# Patient Record
Sex: Female | Born: 1970 | Race: White | Hispanic: No | Marital: Married | Smoking: Never smoker
Health system: Southern US, Community
[De-identification: ages and names within clinical notes are randomized; demographics above are authoritative.]

## PROBLEM LIST (undated history)

## (undated) DIAGNOSIS — D219 Benign neoplasm of connective and other soft tissue, unspecified: Secondary | ICD-10-CM

## (undated) DIAGNOSIS — E7800 Pure hypercholesterolemia, unspecified: Secondary | ICD-10-CM

## (undated) HISTORY — PX: TUBAL LIGATION: SHX77

## (undated) HISTORY — PX: OTHER SURGICAL HISTORY: SHX169

## (undated) HISTORY — DX: Benign neoplasm of connective and other soft tissue, unspecified: D21.9

## (undated) HISTORY — PX: BREAST CYST ASPIRATION: SHX578

---

## 1998-06-20 ENCOUNTER — Other Ambulatory Visit: Admission: RE | Admit: 1998-06-20 | Discharge: 1998-06-20 | Payer: Self-pay | Admitting: Obstetrics and Gynecology

## 1999-01-30 ENCOUNTER — Other Ambulatory Visit: Admission: RE | Admit: 1999-01-30 | Discharge: 1999-01-30 | Payer: Self-pay | Admitting: Obstetrics and Gynecology

## 1999-08-02 ENCOUNTER — Encounter: Admission: RE | Admit: 1999-08-02 | Discharge: 1999-10-31 | Payer: Self-pay | Admitting: Obstetrics and Gynecology

## 1999-08-19 ENCOUNTER — Inpatient Hospital Stay (HOSPITAL_COMMUNITY): Admission: AD | Admit: 1999-08-19 | Discharge: 1999-08-21 | Payer: Self-pay | Admitting: Obstetrics and Gynecology

## 1999-10-01 ENCOUNTER — Other Ambulatory Visit: Admission: RE | Admit: 1999-10-01 | Discharge: 1999-10-01 | Payer: Self-pay | Admitting: Obstetrics and Gynecology

## 2000-11-30 ENCOUNTER — Other Ambulatory Visit: Admission: RE | Admit: 2000-11-30 | Discharge: 2000-11-30 | Payer: Self-pay | Admitting: Obstetrics and Gynecology

## 2001-12-27 ENCOUNTER — Other Ambulatory Visit: Admission: RE | Admit: 2001-12-27 | Discharge: 2001-12-27 | Payer: Self-pay | Admitting: Obstetrics and Gynecology

## 2002-12-30 ENCOUNTER — Other Ambulatory Visit: Admission: RE | Admit: 2002-12-30 | Discharge: 2002-12-30 | Payer: Self-pay | Admitting: Obstetrics and Gynecology

## 2004-01-30 ENCOUNTER — Other Ambulatory Visit: Admission: RE | Admit: 2004-01-30 | Discharge: 2004-01-30 | Payer: Self-pay | Admitting: Obstetrics and Gynecology

## 2005-02-07 ENCOUNTER — Other Ambulatory Visit: Admission: RE | Admit: 2005-02-07 | Discharge: 2005-02-07 | Payer: Self-pay | Admitting: Obstetrics and Gynecology

## 2005-05-28 ENCOUNTER — Ambulatory Visit: Payer: Self-pay | Admitting: Cardiology

## 2006-04-03 ENCOUNTER — Encounter: Admission: RE | Admit: 2006-04-03 | Discharge: 2006-04-03 | Payer: Self-pay | Admitting: Obstetrics and Gynecology

## 2006-04-06 ENCOUNTER — Encounter: Admission: RE | Admit: 2006-04-06 | Discharge: 2006-04-06 | Payer: Self-pay | Admitting: Obstetrics and Gynecology

## 2007-04-08 ENCOUNTER — Encounter: Admission: RE | Admit: 2007-04-08 | Discharge: 2007-04-08 | Payer: Self-pay | Admitting: Obstetrics and Gynecology

## 2008-05-09 ENCOUNTER — Encounter: Admission: RE | Admit: 2008-05-09 | Discharge: 2008-05-09 | Payer: Self-pay | Admitting: Obstetrics and Gynecology

## 2010-12-23 ENCOUNTER — Other Ambulatory Visit: Payer: Self-pay | Admitting: Obstetrics and Gynecology

## 2010-12-23 DIAGNOSIS — N644 Mastodynia: Secondary | ICD-10-CM

## 2010-12-26 ENCOUNTER — Ambulatory Visit
Admission: RE | Admit: 2010-12-26 | Discharge: 2010-12-26 | Disposition: A | Discharge status: Home / Self Care | Payer: BC Managed Care – PPO | Source: Ambulatory Visit | Attending: Obstetrics and Gynecology | Admitting: Obstetrics and Gynecology

## 2010-12-26 DIAGNOSIS — N644 Mastodynia: Secondary | ICD-10-CM

## 2011-02-14 NOTE — Discharge Summary (Signed)
[facility]  Patient:    Molly Roth                   MRN: [id number] Adm. Date:  [id number] Disc. Date: 08/21/99 Attending:  Madelyn Flavors Dictator:   Danie Chandler, R.N.                           Discharge Summary  ADMITTING DIAGNOSES:          1. Intrauterine pregnancy at term, in labor.                               2. Breech presentation.  DISCHARGE DIAGNOSES:          1. Intrauterine pregnancy at term, in labor.                               2. Breech presentation.                               3. Anemia.  PROCEDURE:                    On August 19, 1999. primary low transverse cesarean section.  REASON FOR ADMISSION:         The patient is a 40 year old gravida 2, para 1 married white female with an estimated gestational age of 39-1/2 weeks.  She presented in labor.  Her prenatal course has been uncomplicated.  The baby is in breech presentation.  This was confirmed by bedside ultrasound.  HOSPITAL COURSE:              The patient was taken to the operating room and undergoes the above named procedure without complications.  This is productive f a viable female infant with Apgars of 9 at one minute and 9 at five minutes and an arterial cord pH of 7.31.  Postoperatively the patient does well.   On postoperative day #1 the patients hemoglobin is 8.4, hematocrit 25.1 and white blood cell count 6.8.  On postoperative day #2 the patient requests discharge. She does have good return of bowel function, is tolerating a regular diet.  She also has good pain control and is ambulating well without difficulty.  Her hemoglobin is stable at 8.1.  CONDITION ON DISCHARGE:       Good.  DIET:                         Regular as tolerated.  ACTIVITY:                     No heavy lifting, no driving, no vaginal entry.  FOLLOWUP:                     In the office in two weeks for incision check and  call for temperature greater  than 100 degrees, persistent nausea or vomiting, heavy vaginal bleeding, and/or redness or drainage from the incision site.  DISCHARGE MEDICATIONS:        Prenatal vitamin one p.o. q.d.  Nu-Iron 150 mg one p.o. q.d.  Tylox prescription as prescribed by M.D.DD:  08/21/99 TD:  08/22/99 Job: [id number]

## 2012-01-13 ENCOUNTER — Other Ambulatory Visit: Payer: Self-pay | Admitting: Obstetrics and Gynecology

## 2012-01-13 DIAGNOSIS — Z1231 Encounter for screening mammogram for malignant neoplasm of breast: Secondary | ICD-10-CM

## 2012-01-26 ENCOUNTER — Ambulatory Visit
Admission: RE | Admit: 2012-01-26 | Discharge: 2012-01-26 | Disposition: A | Discharge status: Home / Self Care | Payer: BC Managed Care – PPO | Source: Ambulatory Visit | Attending: Obstetrics and Gynecology | Admitting: Obstetrics and Gynecology

## 2012-01-26 DIAGNOSIS — Z1231 Encounter for screening mammogram for malignant neoplasm of breast: Secondary | ICD-10-CM

## 2012-01-28 ENCOUNTER — Other Ambulatory Visit: Payer: Self-pay | Admitting: Obstetrics and Gynecology

## 2012-01-28 DIAGNOSIS — R928 Other abnormal and inconclusive findings on diagnostic imaging of breast: Secondary | ICD-10-CM

## 2012-01-30 ENCOUNTER — Ambulatory Visit
Admission: RE | Admit: 2012-01-30 | Discharge: 2012-01-30 | Disposition: A | Discharge status: Home / Self Care | Payer: BC Managed Care – PPO | Source: Ambulatory Visit | Attending: Obstetrics and Gynecology | Admitting: Obstetrics and Gynecology

## 2012-01-30 DIAGNOSIS — R928 Other abnormal and inconclusive findings on diagnostic imaging of breast: Secondary | ICD-10-CM

## 2013-03-14 ENCOUNTER — Other Ambulatory Visit: Payer: Self-pay

## 2013-03-14 DIAGNOSIS — Z1231 Encounter for screening mammogram for malignant neoplasm of breast: Secondary | ICD-10-CM

## 2013-03-31 ENCOUNTER — Ambulatory Visit
Admission: RE | Admit: 2013-03-31 | Discharge: 2013-03-31 | Disposition: A | Discharge status: Home / Self Care | Payer: BC Managed Care – PPO | Source: Ambulatory Visit

## 2013-03-31 ENCOUNTER — Inpatient Hospital Stay: Admission: RE | Admit: 2013-03-31 | Payer: BC Managed Care – PPO | Source: Ambulatory Visit

## 2013-03-31 DIAGNOSIS — Z1231 Encounter for screening mammogram for malignant neoplasm of breast: Secondary | ICD-10-CM

## 2013-04-05 ENCOUNTER — Other Ambulatory Visit: Payer: Self-pay | Admitting: Obstetrics and Gynecology

## 2013-04-05 DIAGNOSIS — N6320 Unspecified lump in the left breast, unspecified quadrant: Secondary | ICD-10-CM

## 2013-04-13 ENCOUNTER — Ambulatory Visit
Admission: RE | Admit: 2013-04-13 | Discharge: 2013-04-13 | Disposition: A | Discharge status: Home / Self Care | Payer: BC Managed Care – PPO | Source: Ambulatory Visit | Attending: Obstetrics and Gynecology | Admitting: Obstetrics and Gynecology

## 2013-04-13 ENCOUNTER — Other Ambulatory Visit: Payer: Self-pay | Admitting: Obstetrics and Gynecology

## 2013-04-13 DIAGNOSIS — N6320 Unspecified lump in the left breast, unspecified quadrant: Secondary | ICD-10-CM

## 2014-03-21 ENCOUNTER — Other Ambulatory Visit: Payer: Self-pay

## 2014-03-21 ENCOUNTER — Other Ambulatory Visit: Payer: Self-pay | Admitting: Obstetrics and Gynecology

## 2014-03-21 DIAGNOSIS — Z1231 Encounter for screening mammogram for malignant neoplasm of breast: Secondary | ICD-10-CM

## 2014-04-24 ENCOUNTER — Ambulatory Visit
Admission: RE | Admit: 2014-04-24 | Discharge: 2014-04-24 | Disposition: A | Discharge status: Home / Self Care | Payer: BC Managed Care – PPO | Source: Ambulatory Visit

## 2014-04-24 ENCOUNTER — Encounter (INDEPENDENT_AMBULATORY_CARE_PROVIDER_SITE_OTHER): Payer: Self-pay

## 2014-04-24 DIAGNOSIS — Z1231 Encounter for screening mammogram for malignant neoplasm of breast: Secondary | ICD-10-CM

## 2015-04-04 ENCOUNTER — Other Ambulatory Visit: Payer: Self-pay

## 2015-04-04 DIAGNOSIS — Z1231 Encounter for screening mammogram for malignant neoplasm of breast: Secondary | ICD-10-CM

## 2015-04-30 ENCOUNTER — Ambulatory Visit
Admission: RE | Admit: 2015-04-30 | Discharge: 2015-04-30 | Disposition: A | Discharge status: Home / Self Care | Payer: BLUE CROSS/BLUE SHIELD | Source: Ambulatory Visit

## 2015-04-30 DIAGNOSIS — Z1231 Encounter for screening mammogram for malignant neoplasm of breast: Secondary | ICD-10-CM

## 2016-03-31 ENCOUNTER — Ambulatory Visit (INDEPENDENT_AMBULATORY_CARE_PROVIDER_SITE_OTHER): Payer: BLUE CROSS/BLUE SHIELD | Admitting: Family

## 2016-03-31 ENCOUNTER — Encounter: Payer: Self-pay | Admitting: Family

## 2016-03-31 VITALS — BP 114/75 | HR 95 | Temp 99.2°F | Ht 64.0 in | Wt 154.8 lb

## 2016-03-31 DIAGNOSIS — B029 Zoster without complications: Secondary | ICD-10-CM

## 2016-03-31 MED ORDER — VALACYCLOVIR HCL 1 G PO TABS: 1000.0000 mg | ORAL_TABLET | Freq: Three times a day (TID) | ORAL | Status: DC

## 2016-03-31 NOTE — Progress Notes (Signed)
   Subjective:    Patient ID: Molly Roth, female    DOB: October 08, 1970, 45 y.o.   MRN: [id number]  Rash This is a new problem. The current episode started yesterday. The problem is unchanged. The affected locations include the abdomen (Right back extending to right abdomen). The rash is characterized by blistering, redness and pain. She was exposed to nothing. Pertinent negatives include no congestion, diarrhea, fatigue, joint pain, shortness of breath or sore throat. Past treatments include nothing. The treatment provided no relief.      Review of Systems  Constitutional: Negative.  Negative for fatigue.  HENT: Negative.  Negative for congestion and sore throat.   Eyes: Negative.   Respiratory: Negative.  Negative for shortness of breath.   Cardiovascular: Negative.  Negative for palpitations.  Gastrointestinal: Negative.  Negative for diarrhea.  Endocrine: Negative.   Genitourinary: Negative.   Musculoskeletal: Negative.  Negative for joint pain.  Skin: Positive for rash.  Neurological: Negative.  Negative for headaches.  Hematological: Negative.   Psychiatric/Behavioral: Negative.   All other systems reviewed and are negative.      Objective:   Physical Exam  Constitutional: She is oriented to person, place, and time. She appears well-developed and well-nourished. No distress.  Neck: Normal range of motion. Neck supple. No thyromegaly present.  Cardiovascular: Normal rate, regular rhythm, normal heart sounds and intact distal pulses.   No murmur heard. Pulmonary/Chest: Effort normal and breath sounds normal. No respiratory distress. She has no wheezes.  Abdominal: Soft. Bowel sounds are normal. She exhibits no distension. There is no tenderness.  Musculoskeletal: Normal range of motion. She exhibits no edema or tenderness.  Neurological: She is alert and oriented to person, place, and time. She has normal reflexes. No cranial nerve deficit.  Skin: Skin is warm and dry.  Rash noted.  Linear blister rash that stretches from right middle back to right abdomen  Psychiatric: She has a normal mood and affect. Her behavior is normal. Judgment and thought content normal.  Vitals reviewed.    BP 114/75 mmHg  Pulse 95  Temp(Src) 99.2 F (37.3 C) (Oral)  Ht 5\' 4"  (1.626 m)  Wt 154 lb 12.8 oz (70.217 kg)  BMI 26.56 kg/m2  LMP 03/17/2016 (Approximate)      Assessment & Plan:  1. Shingles -Do not scratch -Avoid immune compromised -Rest -Cool compresses as needed -RTO prn - valACYclovir (VALTREX) 1000 MG tablet; Take 1 tablet (1,000 mg total) by mouth 3 (three) times daily.  Dispense: 21 tablet; Refill: 0  Evelina Dun, FNP

## 2016-03-31 NOTE — Patient Instructions (Signed)

## 2016-04-23 ENCOUNTER — Other Ambulatory Visit: Payer: Self-pay | Admitting: Obstetrics and Gynecology

## 2016-04-23 DIAGNOSIS — Z1231 Encounter for screening mammogram for malignant neoplasm of breast: Secondary | ICD-10-CM

## 2016-05-01 ENCOUNTER — Ambulatory Visit
Admission: RE | Admit: 2016-05-01 | Discharge: 2016-05-01 | Disposition: A | Discharge status: Home / Self Care | Payer: BLUE CROSS/BLUE SHIELD | Source: Ambulatory Visit | Attending: Obstetrics and Gynecology | Admitting: Obstetrics and Gynecology

## 2016-05-01 DIAGNOSIS — Z1231 Encounter for screening mammogram for malignant neoplasm of breast: Secondary | ICD-10-CM

## 2016-05-05 ENCOUNTER — Other Ambulatory Visit: Payer: Self-pay | Admitting: Obstetrics and Gynecology

## 2016-05-05 DIAGNOSIS — R928 Other abnormal and inconclusive findings on diagnostic imaging of breast: Secondary | ICD-10-CM

## 2016-05-09 ENCOUNTER — Ambulatory Visit
Admission: RE | Admit: 2016-05-09 | Discharge: 2016-05-09 | Disposition: A | Discharge status: Home / Self Care | Payer: BLUE CROSS/BLUE SHIELD | Source: Ambulatory Visit | Attending: Obstetrics and Gynecology | Admitting: Obstetrics and Gynecology

## 2016-05-09 DIAGNOSIS — R928 Other abnormal and inconclusive findings on diagnostic imaging of breast: Secondary | ICD-10-CM

## 2017-04-09 ENCOUNTER — Other Ambulatory Visit: Payer: Self-pay | Admitting: Obstetrics and Gynecology

## 2017-04-09 DIAGNOSIS — Z1231 Encounter for screening mammogram for malignant neoplasm of breast: Secondary | ICD-10-CM

## 2017-05-11 ENCOUNTER — Other Ambulatory Visit: Payer: Self-pay | Admitting: Obstetrics and Gynecology

## 2017-05-11 ENCOUNTER — Ambulatory Visit
Admission: RE | Admit: 2017-05-11 | Discharge: 2017-05-11 | Disposition: A | Discharge status: Home / Self Care | Payer: BLUE CROSS/BLUE SHIELD | Source: Ambulatory Visit | Attending: Obstetrics and Gynecology | Admitting: Obstetrics and Gynecology

## 2017-05-11 DIAGNOSIS — Z1231 Encounter for screening mammogram for malignant neoplasm of breast: Secondary | ICD-10-CM

## 2018-05-04 ENCOUNTER — Other Ambulatory Visit: Payer: Self-pay | Admitting: Obstetrics and Gynecology

## 2018-05-04 DIAGNOSIS — Z1231 Encounter for screening mammogram for malignant neoplasm of breast: Secondary | ICD-10-CM

## 2018-05-28 ENCOUNTER — Ambulatory Visit
Admission: RE | Admit: 2018-05-28 | Discharge: 2018-05-28 | Disposition: A | Discharge status: Home / Self Care | Payer: BLUE CROSS/BLUE SHIELD | Source: Ambulatory Visit | Attending: Obstetrics and Gynecology | Admitting: Obstetrics and Gynecology

## 2018-05-28 DIAGNOSIS — Z1231 Encounter for screening mammogram for malignant neoplasm of breast: Secondary | ICD-10-CM

## 2019-06-08 ENCOUNTER — Other Ambulatory Visit: Payer: Self-pay | Admitting: Obstetrics and Gynecology

## 2019-06-08 DIAGNOSIS — Z1231 Encounter for screening mammogram for malignant neoplasm of breast: Secondary | ICD-10-CM

## 2019-07-22 ENCOUNTER — Ambulatory Visit
Admission: RE | Admit: 2019-07-22 | Discharge: 2019-07-22 | Disposition: A | Discharge status: Home / Self Care | Payer: 59 | Source: Ambulatory Visit | Attending: Obstetrics and Gynecology | Admitting: Obstetrics and Gynecology

## 2019-07-22 ENCOUNTER — Other Ambulatory Visit: Payer: Self-pay

## 2019-07-22 DIAGNOSIS — Z1231 Encounter for screening mammogram for malignant neoplasm of breast: Secondary | ICD-10-CM

## 2020-02-16 ENCOUNTER — Other Ambulatory Visit: Payer: Self-pay | Admitting: Obstetrics and Gynecology

## 2020-02-16 DIAGNOSIS — N6320 Unspecified lump in the left breast, unspecified quadrant: Secondary | ICD-10-CM

## 2020-02-24 ENCOUNTER — Other Ambulatory Visit: Payer: Self-pay

## 2020-02-24 ENCOUNTER — Ambulatory Visit
Admission: RE | Admit: 2020-02-24 | Discharge: 2020-02-24 | Disposition: A | Discharge status: Home / Self Care | Payer: 59 | Source: Ambulatory Visit | Attending: Obstetrics and Gynecology | Admitting: Obstetrics and Gynecology

## 2020-02-24 ENCOUNTER — Other Ambulatory Visit: Payer: Self-pay | Admitting: Obstetrics and Gynecology

## 2020-02-24 DIAGNOSIS — N6320 Unspecified lump in the left breast, unspecified quadrant: Secondary | ICD-10-CM

## 2020-03-02 ENCOUNTER — Ambulatory Visit
Admission: RE | Admit: 2020-03-02 | Discharge: 2020-03-02 | Disposition: A | Discharge status: Home / Self Care | Payer: 59 | Source: Ambulatory Visit | Attending: Obstetrics and Gynecology | Admitting: Obstetrics and Gynecology

## 2020-03-02 ENCOUNTER — Other Ambulatory Visit: Payer: Self-pay

## 2020-03-02 DIAGNOSIS — N6320 Unspecified lump in the left breast, unspecified quadrant: Secondary | ICD-10-CM

## 2020-03-23 ENCOUNTER — Other Ambulatory Visit: Payer: Self-pay | Admitting: Surgery

## 2020-03-23 ENCOUNTER — Ambulatory Visit: Payer: Self-pay | Admitting: Surgery

## 2020-03-23 DIAGNOSIS — N6452 Nipple discharge: Secondary | ICD-10-CM

## 2020-06-18 ENCOUNTER — Other Ambulatory Visit: Payer: 59

## 2020-06-18 ENCOUNTER — Other Ambulatory Visit: Payer: Self-pay

## 2020-06-18 DIAGNOSIS — Z20822 Contact with and (suspected) exposure to covid-19: Secondary | ICD-10-CM

## 2020-06-20 LAB — NOVEL CORONAVIRUS, NAA: SARS-CoV-2, NAA: NOT DETECTED

## 2020-06-20 LAB — SARS-COV-2, NAA 2 DAY TAT

## 2020-06-20 LAB — SPECIMEN STATUS REPORT

## 2020-07-04 ENCOUNTER — Other Ambulatory Visit: Payer: 59

## 2020-07-04 ENCOUNTER — Other Ambulatory Visit: Payer: Self-pay

## 2020-07-04 DIAGNOSIS — Z20822 Contact with and (suspected) exposure to covid-19: Secondary | ICD-10-CM

## 2020-07-05 LAB — SPECIMEN STATUS REPORT

## 2020-07-05 LAB — NOVEL CORONAVIRUS, NAA: SARS-CoV-2, NAA: NOT DETECTED

## 2020-07-05 LAB — SARS-COV-2, NAA 2 DAY TAT

## 2020-07-23 ENCOUNTER — Ambulatory Visit
Admission: RE | Admit: 2020-07-23 | Discharge: 2020-07-23 | Disposition: A | Discharge status: Home / Self Care | Payer: No Typology Code available for payment source | Source: Ambulatory Visit | Attending: Surgery | Admitting: Surgery

## 2020-07-23 DIAGNOSIS — N6452 Nipple discharge: Secondary | ICD-10-CM

## 2020-07-23 MED ORDER — GADOBUTROL 1 MMOL/ML IV SOLN
7.0000 mL | Freq: Once | INTRAVENOUS | Status: AC | PRN
  Administered 2020-07-23: 7 mL via INTRAVENOUS

## 2020-07-24 ENCOUNTER — Other Ambulatory Visit: Payer: Self-pay | Admitting: Surgery

## 2020-07-24 DIAGNOSIS — N6310 Unspecified lump in the right breast, unspecified quadrant: Secondary | ICD-10-CM

## 2020-07-26 ENCOUNTER — Other Ambulatory Visit: Payer: Self-pay | Admitting: Surgery

## 2020-07-26 DIAGNOSIS — N6310 Unspecified lump in the right breast, unspecified quadrant: Secondary | ICD-10-CM

## 2020-08-02 ENCOUNTER — Other Ambulatory Visit: Payer: Self-pay

## 2020-08-02 ENCOUNTER — Ambulatory Visit
Admission: RE | Admit: 2020-08-02 | Discharge: 2020-08-02 | Disposition: A | Discharge status: Home / Self Care | Payer: No Typology Code available for payment source | Source: Ambulatory Visit | Attending: Surgery | Admitting: Surgery

## 2020-08-02 ENCOUNTER — Ambulatory Visit
Admission: RE | Admit: 2020-08-02 | Discharge: 2020-08-02 | Disposition: A | Discharge status: Home / Self Care | Payer: 59 | Source: Ambulatory Visit | Attending: Surgery | Admitting: Surgery

## 2020-08-02 DIAGNOSIS — N6310 Unspecified lump in the right breast, unspecified quadrant: Secondary | ICD-10-CM

## 2020-08-02 MED ORDER — GADOBUTROL 1 MMOL/ML IV SOLN
8.0000 mL | Freq: Once | INTRAVENOUS | Status: AC | PRN
  Administered 2020-08-02: 8 mL via INTRAVENOUS

## 2020-08-08 ENCOUNTER — Other Ambulatory Visit: Payer: Self-pay | Admitting: Surgery

## 2020-08-08 DIAGNOSIS — N611 Abscess of the breast and nipple: Secondary | ICD-10-CM

## 2020-08-08 DIAGNOSIS — R9389 Abnormal findings on diagnostic imaging of other specified body structures: Secondary | ICD-10-CM

## 2020-09-03 ENCOUNTER — Ambulatory Visit
Admission: RE | Admit: 2020-09-03 | Discharge: 2020-09-03 | Disposition: A | Discharge status: Home / Self Care | Payer: No Typology Code available for payment source | Source: Ambulatory Visit | Attending: Surgery | Admitting: Surgery

## 2020-09-03 ENCOUNTER — Ambulatory Visit
Admission: RE | Admit: 2020-09-03 | Discharge: 2020-09-03 | Disposition: A | Discharge status: Home / Self Care | Payer: Self-pay | Source: Ambulatory Visit | Attending: Surgery | Admitting: Surgery

## 2020-09-03 ENCOUNTER — Other Ambulatory Visit: Payer: Self-pay

## 2020-09-03 ENCOUNTER — Other Ambulatory Visit (HOSPITAL_COMMUNITY): Payer: Self-pay | Admitting: Diagnostic Radiology

## 2020-09-03 DIAGNOSIS — N611 Abscess of the breast and nipple: Secondary | ICD-10-CM

## 2020-09-03 DIAGNOSIS — R9389 Abnormal findings on diagnostic imaging of other specified body structures: Secondary | ICD-10-CM

## 2020-09-03 MED ORDER — GADOBUTROL 1 MMOL/ML IV SOLN: 7.0000 mL | Freq: Once | INTRAVENOUS | Status: DC | PRN

## 2020-09-10 ENCOUNTER — Ambulatory Visit: Payer: Self-pay | Admitting: Surgery

## 2020-09-10 DIAGNOSIS — D242 Benign neoplasm of left breast: Secondary | ICD-10-CM

## 2020-09-10 NOTE — H&P (Signed)
History of Present Illness Molly Roth. Audrianna Driskill MD; 09/10/2020 6:20 PM) The patient is a 49 year old female who presents with a breast mass. PCP - [place]   This is a healthy 49 year old female who recently presented with a small palpable retroareolar mass as well as some clear nipple discharge. She underwent mammogram and ultrasound. She has multiple cysts that likely represented the palpable mass. However in the left retroareolar breast at 5:00 there is a 1.4 cm intraductal mass. Ultrasound-guided biopsy was performed. However pathology revealed only normal mammary tissue. She presents now to discuss further workup and treatment. She states that she continues to see occasional discharge. She has no pain in this breast. No previous breast surgery. She does have a long history of breast cysts but fluctuate in size.  Family history of breast cancer in 2 different maternal aunts.  MRI on 07/23/20 showed a indeterminate rim enhancing cyst in the left breast, which was later biopsied under MR guidance. This revealed intraductal papilloma. The MRI also a 9 mm mass in the right lower outer quadrant at anterior depth, which was biopsied under MRI guidance. This revealed ductal papilloma, fibroadenoma, usual ductal hyperplasia. Biopsy clips were placed at all three sites.   Problem List/Past Medical Rodman Key K. 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 Josedaniel Haye, MD; 09/10/2020 6:21 PM) Breast Biopsy Left. Cesarean Section - 1  Diagnostic Studies History Molly Roth. Mirza Fessel, MD; 09/10/2020 6:21 PM) Colonoscopy never Mammogram within last year Pap Smear 1-5 years ago  Allergies Mallie Snooks, CMA; 09/10/2020 9:15 AM) No Known Drug Allergies [03/23/2020]: Allergies Reconciled  Medication History Mallie Snooks, CMA; 09/10/2020 9:15 AM) Multiple Vitamin (1 (one) Oral) Active. No Current Medications (Taken starting 09/10/2020) Zinc (Oral) Specific strength unknown - Active. Medications Reconciled  Social History Molly Roth. Costella Schwarz, MD; 09/10/2020 6:21 PM) Caffeine use Carbonated beverages. No alcohol use No drug use Tobacco use Never smoker.  Family History Molly Roth. Georgette Dover, MD; 09/10/2020 6:21 PM) First Degree Relatives No pertinent family history  Pregnancy / Birth History Molly Roth. Bryn Saline, MD; 09/10/2020 6:21 PM) Age at menarche 47 years. Gravida 2 Length (months) of breastfeeding 3-6 Maternal age 54-25 Para 2 Regular periods  Other Problems Molly Roth. Josua Ferrebee, MD; 09/10/2020 6:21 PM) Hypercholesterolemia    Vitals Lonia Mad Thorpe CMA; 09/10/2020 9:15 AM) 09/10/2020 9:15 AM Weight: 171.13 lb Height: 63in Body Surface Area: 1.81 m Body Mass Index: 30.31 kg/m  Temp.: 96.18F  Pulse: 115 (Regular)  P.OX: 99% (Room air) BP: 150/90(Sitting, Left Arm, Standard)        Physical Exam Rodman Key K. Smriti Barkow MD; 09/10/2020 6:21 PM)  The physical exam findings are as follows: Note:Constitutional: WDWN in NAD, conversant, no obvious deformities; resting comfortably Eyes: Pupils equal, round; sclera anicteric; moist conjunctiva; no lid lag HENT: Oral mucosa moist; good dentition Neck: No masses palpated, trachea midline; no thyromegaly Lungs: CTA bilaterally; normal respiratory effort Breasts: symmetric; no nipple changes; no nipple discharge; palpable cystic areas in both lateral breasts; no dominant firm masses; no axillary or supraclavicular lymphadenopathy; bruising in lateral left breast CV: Regular rate and rhythm; no murmurs; extremities well-perfused with no edema Abd: +bowel sounds, soft, non-tender, no palpable organomegaly; no palpable hernias Musc: Normal gait; no apparent clubbing or cyanosis in extremities Lymphatic: No palpable  cervical or axillary lymphadenopathy Skin: Warm, dry; no sign of jaundice  Psychiatric - alert and oriented x 4; calm mood and affect    Assessment & Plan Rodman Key K. 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 Potential risks, benefits, alternative treatments, and expected outcomes have been explained. All of the patient's questions at this time have been answered. The likelihood of reaching the patient's treatment goal is good. The patient understand the proposed surgical procedure and wishes to proceed.  Molly Roth. Georgette Dover, MD, [facility]  General/ Trauma Surgery   09/10/2020 6:22 PM

## 2020-10-08 ENCOUNTER — Other Ambulatory Visit: Payer: Self-pay | Admitting: Surgery

## 2020-10-08 DIAGNOSIS — D242 Benign neoplasm of left breast: Secondary | ICD-10-CM

## 2020-10-08 DIAGNOSIS — D241 Benign neoplasm of right breast: Secondary | ICD-10-CM

## 2020-10-29 ENCOUNTER — Other Ambulatory Visit: Payer: Self-pay

## 2020-10-29 ENCOUNTER — Encounter (HOSPITAL_BASED_OUTPATIENT_CLINIC_OR_DEPARTMENT_OTHER): Payer: Self-pay | Admitting: Surgery

## 2020-11-02 ENCOUNTER — Other Ambulatory Visit (HOSPITAL_COMMUNITY)
Admission: RE | Admit: 2020-11-02 | Discharge: 2020-11-02 | Disposition: A | Discharge status: Home / Self Care | Payer: 59 | Source: Ambulatory Visit | Attending: Surgery | Admitting: Surgery

## 2020-11-02 DIAGNOSIS — Z01812 Encounter for preprocedural laboratory examination: Secondary | ICD-10-CM | POA: Diagnosis present

## 2020-11-02 DIAGNOSIS — U071 COVID-19: Secondary | ICD-10-CM | POA: Diagnosis not present

## 2020-11-02 LAB — SARS CORONAVIRUS 2 (TAT 6-24 HRS): SARS Coronavirus 2: POSITIVE — AB

## 2020-11-02 NOTE — Progress Notes (Signed)

## 2020-11-03 ENCOUNTER — Encounter: Payer: Self-pay | Admitting: Oncology

## 2020-11-03 ENCOUNTER — Telehealth (HOSPITAL_COMMUNITY): Payer: Self-pay | Admitting: Oncology

## 2020-11-03 NOTE — Progress Notes (Signed)
Dr. Denyse Dago called back and + covid results relayed.

## 2020-11-03 NOTE — Progress Notes (Signed)
Molly Roth from the on-call service of Dr. Jonny Ruiz with + covid results for this pt. The pt is to have surgery on Tues 2/8 at [facility]. The pt has not been notified as there will be pertinent questions the provider needs to answer.  These are the current guidelines:  Positive Results for:  Asymptomatic: Procedure postponed and quarantined for 10 days, unless the procedure is urgent. Symptomatic: Procedure postponed and quarantined for 14 days. Hospitalized with Covid: postponed and quarantined  for 21 days. Immunocompromised: Procedure postponed and quarantine for 20 days.  The pt will not be retested for 90 days from the + result.

## 2020-11-03 NOTE — Telephone Encounter (Signed)
Called to discuss with patient about COVID-19 symptoms and the use of one of the available treatments for those with mild to moderate Covid symptoms and at a high risk of hospitalization.  Pt appears to qualify for outpatient treatment due to co-morbid conditions and/or a member of an at-risk group in accordance with the FDA Emergency Use Authorization.    Symptom onset: unsure Pre-screening for surgery Vaccinated: Unsure   Booster? No  Immunocompromised? No  Qualifiers: No past medical history on file.   Unable to reach pt - Left vm and mcm   Jacquelin Hawking

## 2020-11-05 NOTE — Progress Notes (Signed)
Called Dr Vonna Kotyk office and LM on VM that patient was covid + and she has seed placement scheduled for today that would need to be rescheduled. Informed we would drop the case to the depot for rescheduling.

## 2020-11-27 DIAGNOSIS — C50919 Malignant neoplasm of unspecified site of unspecified female breast: Secondary | ICD-10-CM

## 2020-11-27 HISTORY — DX: Malignant neoplasm of unspecified site of unspecified female breast: C50.919

## 2020-11-28 ENCOUNTER — Encounter (HOSPITAL_COMMUNITY)
Admission: RE | Admit: 2020-11-28 | Discharge: 2020-11-28 | Disposition: A | Discharge status: Home / Self Care | Payer: 59 | Source: Ambulatory Visit | Attending: Surgery | Admitting: Surgery

## 2020-11-28 ENCOUNTER — Encounter (HOSPITAL_COMMUNITY): Payer: Self-pay

## 2020-11-28 ENCOUNTER — Other Ambulatory Visit: Payer: Self-pay

## 2020-11-28 DIAGNOSIS — Z01812 Encounter for preprocedural laboratory examination: Secondary | ICD-10-CM | POA: Insufficient documentation

## 2020-11-28 LAB — CBC
HCT: 35.2 % — ABNORMAL LOW (ref 36.0–46.0)
Hemoglobin: 11.3 g/dL — ABNORMAL LOW (ref 12.0–15.0)
MCH: 27.2 pg (ref 26.0–34.0)
MCHC: 32.1 g/dL (ref 30.0–36.0)
MCV: 84.6 fL (ref 80.0–100.0)
Platelets: 351 10*3/uL (ref 150–400)
RBC: 4.16 MIL/uL (ref 3.87–5.11)
RDW: 15.3 % (ref 11.5–15.5)
WBC: 5.7 10*3/uL (ref 4.0–10.5)
nRBC: 0 % (ref 0.0–0.2)

## 2020-11-28 NOTE — Progress Notes (Signed)
Surgical Instructions    Your procedure is scheduled on 12/05/20.  Report to [facility] Main Entrance "A" at 1:00 P.M., then check in with the Admitting office.  Call this number if you have problems the morning of surgery:  [phone]   If you have any questions prior to your surgery date call [phone]: Open Monday-Friday 8am-4pm    Remember:  Do not eat or drink after midnight the night before your surgery    Take these medicines the morning of surgery with A SIP OF WATER  atorvastatin (LIPITOR)    As of today, STOP taking any Aspirin (unless otherwise instructed by your surgeon) Aleve, Naproxen, Ibuprofen, Motrin, Advil, Goody's, BC's, all herbal medications, fish oil, and all vitamins.                     Do not wear jewelry, make up, or nail polish            Do not wear lotions, powders, perfumes/colognes, or deodorant.            Do not shave 48 hours prior to surgery.              Do not bring valuables to the hospital.            [facility] is not responsible for any belongings or valuables.  Do NOT Smoke (Tobacco/Vaping) or drink Alcohol 24 hours prior to your procedure If you use a CPAP at night, you may bring all equipment for your overnight stay.   Contacts, glasses, dentures or bridgework may not be worn into surgery, please bring cases for these belongings   For patients admitted to the hospital, discharge time will be determined by your treatment team.   Patients discharged the day of surgery will not be allowed to drive home, and someone needs to stay with them for 24 hours.    Special instructions:   [facility]- Preparing For Surgery  Before surgery, you can play an important role. Because skin is not sterile, your skin needs to be as free of germs as possible. You can reduce the number of germs on your skin by washing with CHG (chlorahexidine gluconate) Soap before surgery.  CHG is an antiseptic cleaner which kills germs and bonds with the skin to  continue killing germs even after washing.    Oral Hygiene is also important to reduce your risk of infection.  Remember - BRUSH YOUR TEETH THE MORNING OF SURGERY WITH YOUR REGULAR TOOTHPASTE  Please do not use if you have an allergy to CHG or antibacterial soaps. If your skin becomes reddened/irritated stop using the CHG.  Do not shave (including legs and underarms) for at least 48 hours prior to first CHG shower. It is OK to shave your face.  Please follow these instructions carefully.   1. Shower the NIGHT BEFORE SURGERY and the MORNING OF SURGERY  2. If you chose to wash your hair, wash your hair first as usual with your normal shampoo.  3. After you shampoo, rinse your hair and body thoroughly to remove the shampoo.  4. Wash Face and genitals (private parts) with your normal soap.   5.  Shower the NIGHT BEFORE SURGERY and the MORNING OF SURGERY with CHG Soap.   6. Use CHG Soap as you would any other liquid soap. You can apply CHG directly to the skin and wash gently with a scrungie or a clean washcloth.   7. Apply the CHG Soap  to your body ONLY FROM THE NECK DOWN.  Do not use on open wounds or open sores. Avoid contact with your eyes, ears, mouth and genitals (private parts). Wash Face and genitals (private parts)  with your normal soap.   8. Wash thoroughly, paying special attention to the area where your surgery will be performed.  9. Thoroughly rinse your body with warm water from the neck down.  10. DO NOT shower/wash with your normal soap after using and rinsing off the CHG Soap.  11. Pat yourself dry with a CLEAN TOWEL.  12. Wear CLEAN PAJAMAS to bed the night before surgery  13. Place CLEAN SHEETS on your bed the night before your surgery  14. DO NOT SLEEP WITH PETS.   Day of Surgery: Take a shower.  Wear Clean/Comfortable clothing the morning of surgery Do not apply any deodorants/lotions.   Remember to brush your teeth WITH YOUR REGULAR TOOTHPASTE.   Please  read over the following fact sheets that you were given.

## 2020-11-28 NOTE — Progress Notes (Signed)
Surgical Instructions    Your procedure is scheduled on 12/05/20.  Report to [facility] Main Entrance "A" at 1:00 P.M., then check in with the Admitting office.  Call this number if you have problems the morning of surgery:  [phone]   If you have any questions prior to your surgery date call [phone]: Open Monday-Friday 8am-4pm    Remember:  Do not eat after midnight the night before your surgery  You may drink clear liquids until 12:00 PM the morning of your surgery.   Clear liquids allowed are: Water, Non-Citrus Juices (without pulp), Carbonated Beverages, Clear Tea, Black Coffee Only, and Gatorade    Take these medicines the morning of surgery with A SIP OF WATER  atorvastatin (LIPITOR)  As of today, STOP taking any Aspirin (unless otherwise instructed by your surgeon) Aleve, Naproxen, Ibuprofen, Motrin, Advil, Goody's, BC's, all herbal medications, fish oil, and all vitamins.                     Do not wear jewelry, make up, or nail polish            Do not wear lotions, powders, perfumes/colognes, or deodorant.            Do not shave 48 hours prior to surgery.  Men may shave face and neck.            Do not bring valuables to the hospital.            [facility] is not responsible for any belongings or valuables.  Do NOT Smoke (Tobacco/Vaping) or drink Alcohol 24 hours prior to your procedure If you use a CPAP at night, you may bring all equipment for your overnight stay.   Contacts, glasses, dentures or bridgework may not be worn into surgery, please bring cases for these belongings   For patients admitted to the hospital, discharge time will be determined by your treatment team.   Patients discharged the day of surgery will not be allowed to drive home, and someone needs to stay with them for 24 hours.    Special instructions:   [place]- Preparing For Surgery  Before surgery, you can play an important role. Because skin is not sterile, your skin needs to  be as free of germs as possible. You can reduce the number of germs on your skin by washing with CHG (chlorahexidine gluconate) Soap before surgery.  CHG is an antiseptic cleaner which kills germs and bonds with the skin to continue killing germs even after washing.    Oral Hygiene is also important to reduce your risk of infection.  Remember - BRUSH YOUR TEETH THE MORNING OF SURGERY WITH YOUR REGULAR TOOTHPASTE  Please do not use if you have an allergy to CHG or antibacterial soaps. If your skin becomes reddened/irritated stop using the CHG.  Do not shave (including legs and underarms) for at least 48 hours prior to first CHG shower. It is OK to shave your face.  Please follow these instructions carefully.   1. Shower the NIGHT BEFORE SURGERY and the MORNING OF SURGERY  2. If you chose to wash your hair, wash your hair first as usual with your normal shampoo.  3. After you shampoo, rinse your hair and body thoroughly to remove the shampoo.  4. Wash Face and genitals (private parts) with your normal soap.   5.  Shower the NIGHT BEFORE SURGERY and the MORNING OF SURGERY with CHG Soap.   6.  Use CHG Soap as you would any other liquid soap. You can apply CHG directly to the skin and wash gently with a scrungie or a clean washcloth.   7. Apply the CHG Soap to your body ONLY FROM THE NECK DOWN.  Do not use on open wounds or open sores. Avoid contact with your eyes, ears, mouth and genitals (private parts). Wash Face and genitals (private parts)  with your normal soap.   8. Wash thoroughly, paying special attention to the area where your surgery will be performed.  9. Thoroughly rinse your body with warm water from the neck down.  10. DO NOT shower/wash with your normal soap after using and rinsing off the CHG Soap.  11. Pat yourself dry with a CLEAN TOWEL.  12. Wear CLEAN PAJAMAS to bed the night before surgery  13. Place CLEAN SHEETS on your bed the night before your surgery  14. DO NOT  SLEEP WITH PETS.   Day of Surgery: Wear Clean/Comfortable clothing the morning of surgery Do not apply any deodorants/lotions.   Remember to brush your teeth WITH YOUR REGULAR TOOTHPASTE.   Please read over the following fact sheets that you were given.

## 2020-11-28 NOTE — Progress Notes (Signed)
PCP - Allyn Kenner Cardiologist - denies  PPM/ICD - no   Chest x-ray - n/a EKG - n/a Stress Test - denies ECHO - denies Cardiac Cath - denies Sleep Study -  CPAP -    Patient instructed to hold all Aspirin, NSAID's, herbal medications, fish oil and vitamins 7 days prior to surgery.   ERAS Protcol -no   COVID TEST- not needed- covid positive on 11/02/20   Anesthesia review: no  Patient denies shortness of breath, fever, cough and chest pain at PAT appointment   All instructions explained to the patient, with a verbal understanding of the material. Patient agrees to go over the instructions while at home for a better understanding. Patient also instructed to self quarantine after being tested for COVID-19. The opportunity to ask questions was provided.

## 2020-12-04 ENCOUNTER — Ambulatory Visit
Admission: RE | Admit: 2020-12-04 | Discharge: 2020-12-04 | Disposition: A | Discharge status: Home / Self Care | Payer: 59 | Source: Ambulatory Visit | Attending: Surgery | Admitting: Surgery

## 2020-12-04 ENCOUNTER — Other Ambulatory Visit: Payer: Self-pay

## 2020-12-04 DIAGNOSIS — D242 Benign neoplasm of left breast: Secondary | ICD-10-CM

## 2020-12-04 DIAGNOSIS — D241 Benign neoplasm of right breast: Secondary | ICD-10-CM

## 2020-12-05 ENCOUNTER — Encounter (HOSPITAL_COMMUNITY): Payer: Self-pay | Admitting: Surgery

## 2020-12-05 ENCOUNTER — Other Ambulatory Visit: Payer: Self-pay

## 2020-12-05 ENCOUNTER — Encounter (HOSPITAL_COMMUNITY)
Admission: RE | Disposition: A | Discharge status: Home / Self Care | Payer: Self-pay | Source: Home / Self Care | Attending: Surgery

## 2020-12-05 ENCOUNTER — Ambulatory Visit (HOSPITAL_COMMUNITY): Payer: 59 | Admitting: Certified Registered Nurse Anesthetist

## 2020-12-05 ENCOUNTER — Ambulatory Visit
Admission: RE | Admit: 2020-12-05 | Discharge: 2020-12-05 | Disposition: A | Discharge status: Home / Self Care | Payer: 59 | Source: Ambulatory Visit | Attending: Surgery | Admitting: Surgery

## 2020-12-05 ENCOUNTER — Ambulatory Visit (HOSPITAL_COMMUNITY)
Admission: RE | Admit: 2020-12-05 | Discharge: 2020-12-05 | Disposition: A | Discharge status: Home / Self Care | Payer: 59 | Attending: Surgery | Admitting: Surgery

## 2020-12-05 DIAGNOSIS — D241 Benign neoplasm of right breast: Secondary | ICD-10-CM

## 2020-12-05 DIAGNOSIS — D242 Benign neoplasm of left breast: Secondary | ICD-10-CM | POA: Insufficient documentation

## 2020-12-05 DIAGNOSIS — Z803 Family history of malignant neoplasm of breast: Secondary | ICD-10-CM | POA: Insufficient documentation

## 2020-12-05 DIAGNOSIS — D0501 Lobular carcinoma in situ of right breast: Secondary | ICD-10-CM | POA: Diagnosis not present

## 2020-12-05 DIAGNOSIS — N6489 Other specified disorders of breast: Secondary | ICD-10-CM | POA: Insufficient documentation

## 2020-12-05 HISTORY — PX: BREAST LUMPECTOMY WITH RADIOACTIVE SEED AND AXILLARY LYMPH NODE DISSECTION: SHX6656

## 2020-12-05 HISTORY — DX: Pure hypercholesterolemia, unspecified: E78.00

## 2020-12-05 HISTORY — PX: BREAST LUMPECTOMY WITH RADIOACTIVE SEED LOCALIZATION: SHX6424

## 2020-12-05 HISTORY — PX: BREAST EXCISIONAL BIOPSY: SUR124

## 2020-12-05 LAB — POCT PREGNANCY, URINE: Preg Test, Ur: NEGATIVE

## 2020-12-05 SURGERY — BREAST LUMPECTOMY WITH RADIOACTIVE SEED LOCALIZATION
Anesthesia: General | Site: Breast | Laterality: Right

## 2020-12-05 MED ORDER — LACTATED RINGERS IV SOLN: INTRAVENOUS | Status: DC

## 2020-12-05 MED ORDER — BUPIVACAINE HCL (PF) 0.25 % IJ SOLN
INTRAMUSCULAR | Status: AC
  Filled 2020-12-05: qty 30

## 2020-12-05 MED ORDER — ACETAMINOPHEN 500 MG PO TABS
ORAL_TABLET | ORAL | Status: AC
  Administered 2020-12-05: 1000 mg via ORAL
  Filled 2020-12-05: qty 2

## 2020-12-05 MED ORDER — CEFAZOLIN SODIUM-DEXTROSE 2-4 GM/100ML-% IV SOLN
INTRAVENOUS | Status: AC
  Filled 2020-12-05: qty 100

## 2020-12-05 MED ORDER — TRAMADOL HCL 50 MG PO TABS
50.0000 mg | ORAL_TABLET | Freq: Once | ORAL | Status: AC
  Administered 2020-12-05: 50 mg via ORAL

## 2020-12-05 MED ORDER — LIDOCAINE HCL (CARDIAC) PF 100 MG/5ML IV SOSY
PREFILLED_SYRINGE | INTRAVENOUS | Status: DC | PRN
  Administered 2020-12-05: 30 mg via INTRAVENOUS

## 2020-12-05 MED ORDER — CHLORHEXIDINE GLUCONATE CLOTH 2 % EX PADS: 6.0000 | MEDICATED_PAD | Freq: Once | CUTANEOUS | Status: DC

## 2020-12-05 MED ORDER — TRAMADOL HCL 50 MG PO TABS: 50.0000 mg | ORAL_TABLET | Freq: Four times a day (QID) | ORAL | 0 refills | Status: DC | PRN

## 2020-12-05 MED ORDER — FENTANYL CITRATE (PF) 100 MCG/2ML IJ SOLN
INTRAMUSCULAR | Status: DC | PRN
  Administered 2020-12-05 (×2): 50 ug via INTRAVENOUS

## 2020-12-05 MED ORDER — CEFAZOLIN SODIUM-DEXTROSE 2-4 GM/100ML-% IV SOLN
2.0000 g | INTRAVENOUS | Status: AC
  Administered 2020-12-05: 2 g via INTRAVENOUS

## 2020-12-05 MED ORDER — ONDANSETRON HCL 4 MG/2ML IJ SOLN
INTRAMUSCULAR | Status: DC | PRN
  Administered 2020-12-05: 4 mg via INTRAVENOUS

## 2020-12-05 MED ORDER — BUPIVACAINE HCL (PF) 0.25 % IJ SOLN
INTRAMUSCULAR | Status: DC | PRN
  Administered 2020-12-05: 20 mL

## 2020-12-05 MED ORDER — 0.9 % SODIUM CHLORIDE (POUR BTL) OPTIME
TOPICAL | Status: DC | PRN
  Administered 2020-12-05: 1000 mL

## 2020-12-05 MED ORDER — CHLORHEXIDINE GLUCONATE 0.12 % MT SOLN
OROMUCOSAL | Status: AC
  Administered 2020-12-05: 15 mL via OROMUCOSAL
  Filled 2020-12-05: qty 15

## 2020-12-05 MED ORDER — GABAPENTIN 300 MG PO CAPS: 300.0000 mg | ORAL_CAPSULE | ORAL | Status: AC

## 2020-12-05 MED ORDER — DEXAMETHASONE SODIUM PHOSPHATE 10 MG/ML IJ SOLN
INTRAMUSCULAR | Status: DC | PRN
  Administered 2020-12-05: 10 mg via INTRAVENOUS

## 2020-12-05 MED ORDER — HYDROMORPHONE HCL 1 MG/ML IJ SOLN: 0.2500 mg | INTRAMUSCULAR | Status: DC | PRN

## 2020-12-05 MED ORDER — ORAL CARE MOUTH RINSE: 15.0000 mL | Freq: Once | OROMUCOSAL | Status: AC

## 2020-12-05 MED ORDER — GABAPENTIN 300 MG PO CAPS
ORAL_CAPSULE | ORAL | Status: AC
  Administered 2020-12-05: 300 mg via ORAL
  Filled 2020-12-05: qty 1

## 2020-12-05 MED ORDER — FENTANYL CITRATE (PF) 250 MCG/5ML IJ SOLN
INTRAMUSCULAR | Status: AC
  Filled 2020-12-05: qty 5

## 2020-12-05 MED ORDER — LACTATED RINGERS IV SOLN: INTRAVENOUS | Status: DC | PRN

## 2020-12-05 MED ORDER — TRAMADOL HCL 50 MG PO TABS
ORAL_TABLET | ORAL | Status: AC
  Filled 2020-12-05: qty 1

## 2020-12-05 MED ORDER — KETOROLAC TROMETHAMINE 30 MG/ML IJ SOLN
INTRAMUSCULAR | Status: DC | PRN
  Administered 2020-12-05: 30 mg via INTRAVENOUS

## 2020-12-05 MED ORDER — MIDAZOLAM HCL 5 MG/5ML IJ SOLN
INTRAMUSCULAR | Status: DC | PRN
  Administered 2020-12-05: 2 mg via INTRAVENOUS

## 2020-12-05 MED ORDER — CHLORHEXIDINE GLUCONATE 0.12 % MT SOLN: 15.0000 mL | Freq: Once | OROMUCOSAL | Status: AC

## 2020-12-05 MED ORDER — MIDAZOLAM HCL 2 MG/2ML IJ SOLN
INTRAMUSCULAR | Status: AC
  Filled 2020-12-05: qty 2

## 2020-12-05 MED ORDER — PROPOFOL 10 MG/ML IV BOLUS
INTRAVENOUS | Status: DC | PRN
  Administered 2020-12-05: 150 mg via INTRAVENOUS

## 2020-12-05 MED ORDER — ACETAMINOPHEN 500 MG PO TABS: 1000.0000 mg | ORAL_TABLET | ORAL | Status: AC

## 2020-12-05 SURGICAL SUPPLY — 45 items
APL PRP STRL LF DISP 70% ISPRP (MISCELLANEOUS) ×2
APL SKNCLS STERI-STRIP NONHPOA (GAUZE/BANDAGES/DRESSINGS) ×4
APPLIER CLIP 9.375 MED OPEN (MISCELLANEOUS)
APR CLP MED 9.3 20 MLT OPN (MISCELLANEOUS)
BENZOIN TINCTURE PRP APPL 2/3 (GAUZE/BANDAGES/DRESSINGS) ×4 IMPLANT
BINDER BREAST LRG (GAUZE/BANDAGES/DRESSINGS) IMPLANT
BINDER BREAST XLRG (GAUZE/BANDAGES/DRESSINGS) ×1 IMPLANT
CANISTER SUCT 3000ML PPV (MISCELLANEOUS) ×3 IMPLANT
CHLORAPREP W/TINT 26 (MISCELLANEOUS) ×3 IMPLANT
CLIP APPLIE 9.375 MED OPEN (MISCELLANEOUS) IMPLANT
CLSR STERI-STRIP ANTIMIC 1/2X4 (GAUZE/BANDAGES/DRESSINGS) ×2 IMPLANT
COVER PROBE W GEL 5X96 (DRAPES) ×3 IMPLANT
COVER SURGICAL LIGHT HANDLE (MISCELLANEOUS) ×3 IMPLANT
COVER WAND RF STERILE (DRAPES) ×3 IMPLANT
DEVICE DUBIN SPECIMEN MAMMOGRA (MISCELLANEOUS) ×3 IMPLANT
DRAPE CHEST BREAST 15X10 FENES (DRAPES) ×3 IMPLANT
DRSG TEGADERM 4X10 (GAUZE/BANDAGES/DRESSINGS) ×2 IMPLANT
DRSG TEGADERM 4X4.75 (GAUZE/BANDAGES/DRESSINGS) ×3 IMPLANT
ELECT CAUTERY BLADE 6.4 (BLADE) ×3 IMPLANT
ELECT REM PT RETURN 9FT ADLT (ELECTROSURGICAL) ×3
ELECTRODE REM PT RTRN 9FT ADLT (ELECTROSURGICAL) ×2 IMPLANT
GAUZE SPONGE 2X2 8PLY STRL LF (GAUZE/BANDAGES/DRESSINGS) ×2 IMPLANT
GLOVE BIO SURGEON STRL SZ7 (GLOVE) ×3 IMPLANT
GLOVE BIOGEL PI IND STRL 7.5 (GLOVE) ×2 IMPLANT
GLOVE BIOGEL PI INDICATOR 7.5 (GLOVE) ×1
GOWN STRL REUS W/ TWL LRG LVL3 (GOWN DISPOSABLE) ×4 IMPLANT
GOWN STRL REUS W/TWL LRG LVL3 (GOWN DISPOSABLE) ×6
ILLUMINATOR WAVEGUIDE N/F (MISCELLANEOUS) IMPLANT
KIT BASIN OR (CUSTOM PROCEDURE TRAY) ×3 IMPLANT
KIT MARKER MARGIN INK (KITS) ×3 IMPLANT
LIGHT WAVEGUIDE WIDE FLAT (MISCELLANEOUS) IMPLANT
NDL HYPO 25GX1X1/2 BEV (NEEDLE) ×2 IMPLANT
NEEDLE HYPO 25GX1X1/2 BEV (NEEDLE) ×3 IMPLANT
NS IRRIG 1000ML POUR BTL (IV SOLUTION) ×3 IMPLANT
PACK GENERAL/GYN (CUSTOM PROCEDURE TRAY) ×3 IMPLANT
SPONGE GAUZE 2X2 STER 10/PKG (GAUZE/BANDAGES/DRESSINGS) ×2
SPONGE LAP 4X18 RFD (DISPOSABLE) ×3 IMPLANT
STRIP CLOSURE SKIN 1/2X4 (GAUZE/BANDAGES/DRESSINGS) ×3 IMPLANT
SUT MNCRL AB 4-0 PS2 18 (SUTURE) ×3 IMPLANT
SUT SILK 2 0 SH (SUTURE) IMPLANT
SUT VIC AB 3-0 SH 27 (SUTURE) ×3
SUT VIC AB 3-0 SH 27X BRD (SUTURE) ×2 IMPLANT
SYR CONTROL 10ML LL (SYRINGE) ×3 IMPLANT
TOWEL GREEN STERILE (TOWEL DISPOSABLE) ×3 IMPLANT
TOWEL GREEN STERILE FF (TOWEL DISPOSABLE) ×3 IMPLANT

## 2020-12-05 NOTE — Transfer of Care (Signed)
Immediate Anesthesia Transfer of Care Note  Patient: Molly Roth  Procedure(s) Performed: RIGHT BREAST LUMPECTOMY  WITH RADIOACTIVE SEED LOCALIZATION (Right Breast) LEFT BREAST LUMPECTOMY WITH RADIOACTIVE SEEDS LOCALIZATION X2 (Left Breast)  Patient Location: PACU  Anesthesia Type:General  Level of Consciousness: awake, alert  and oriented  Airway & Oxygen Therapy: Patient Spontanous Breathing  Post-op Assessment: Report given to RN and Post -op Vital signs reviewed and stable  Post vital signs: Reviewed and stable  Last Vitals:  Vitals Value Taken Time  BP 116/78 12/05/20 1623  Temp 36.1 C 12/05/20 1623  Pulse 95 12/05/20 1631  Resp 16 12/05/20 1631  SpO2 99 % 12/05/20 1631  Vitals shown include unvalidated device data.  Last Pain:  Vitals:   12/05/20 1623  TempSrc:   PainSc: 0-No pain         Complications: No complications documented.

## 2020-12-05 NOTE — H&P (Signed)
History of Present Illness  The patient is a 50 year old female who presents with a breast mass.  PCP - [place]   This is a healthy 50 year old female who recently presented with a small palpable retroareolar mass as well as some clear nipple discharge. She underwent mammogram and ultrasound. She has multiple cysts that likely represented the palpable mass. However in the left retroareolar breast at 5:00 there is a 1.4 cm intraductal mass. Ultrasound-guided biopsy was performed. However pathology revealed only normal mammary tissue. She presents now to discuss further workup and treatment. She states that she continues to see occasional discharge. She has no pain in this breast. No previous breast surgery. She does have a long history of breast cysts but fluctuate in size.  Family history of breast cancer in 2 different maternal aunts.  MRI on 07/23/20 showed a indeterminate rim enhancing cyst in the left breast, which was later biopsied under MR guidance. This revealed intraductal papilloma. The MRI also a 9 mm mass in the right lower outer quadrant at anterior depth, which was biopsied under MRI guidance. This revealed ductal papilloma, fibroadenoma, usual ductal hyperplasia. Biopsy clips were placed at all three sites.   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 Cesarean Section - 1  Diagnostic Studies History Colonoscopy never Mammogram within last year Pap Smear 1-5 years ago  Allergies  No Known Drug Allergies  Allergies Reconciled  Medication History  Multiple Vitamin (1 (one) Oral) Active. No Current Medications (Taken starting 09/10/2020) Zinc (Oral) Specific strength unknown - Active. Medications Reconciled  Social History  Caffeine use Carbonated  beverages. No alcohol use No drug use Tobacco use Never smoker.  Family History First Degree Relatives No pertinent family history  Pregnancy / Birth History  Age at menarche 71 years. Gravida 2 Length (months) of breastfeeding 3-6 Maternal age 64-25 Para 2 Regular periods  Other Problems  Hypercholesterolemia    Vitals  Weight: 171.13 lb Height: 63in Body Surface Area: 1.81 m Body Mass Index: 30.31 kg/m  Temp.: 96.19F  Pulse: 115 (Regular)  P.OX: 99% (Room air) BP: 150/90(Sitting, Left Arm, Standard)        Physical Exam   The physical exam findings are as follows: Note:Constitutional: WDWN in NAD, conversant, no obvious deformities; resting comfortably Eyes: Pupils equal, round; sclera anicteric; moist conjunctiva; no lid lag HENT: Oral mucosa moist; good dentition Neck: No masses palpated, trachea midline; no thyromegaly Lungs: CTA bilaterally; normal respiratory effort Breasts: symmetric; no nipple changes; no nipple discharge; palpable cystic areas in both lateral breasts; no dominant firm masses; no axillary or supraclavicular lymphadenopathy; bruising in lateral left breast CV: Regular rate and rhythm; no murmurs; extremities well-perfused with no edema Abd: +bowel sounds, soft, non-tender, no palpable organomegaly; no palpable hernias Musc: Normal gait; no apparent clubbing or cyanosis in extremities Lymphatic: No palpable cervical or axillary lymphadenopathy Skin: Warm, dry; no sign of jaundice Psychiatric - alert and oriented x 4; calm mood and affect    Assessment & Plan  DISCHARGE FROM LEFT NIPPLE (N64.52)   MASS OF LEFT BREAST ON MAMMOGRAM (N63.20)   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  Potential risks, benefits, alternative  treatments, and expected outcomes have been explained. All of the patient's questions at this time have been answered. The likelihood of reaching the patient's treatment goal is good. The patient understand the proposed surgical procedure and wishes to proceed.  Imogene Burn. Georgette Dover, MD, [facility]  General/ Trauma Surgery   12/05/2020 1:13 PM

## 2020-12-05 NOTE — Discharge Instructions (Signed)
[facility] °Office Phone Number [phone] ° °BREAST BIOPSY/ PARTIAL MASTECTOMY: POST OP INSTRUCTIONS ° °Always review your discharge instruction sheet given to you by the facility where your surgery was performed. ° °IF YOU HAVE DISABILITY OR FAMILY LEAVE FORMS, YOU MUST BRING THEM TO THE OFFICE FOR PROCESSING.  DO NOT GIVE THEM TO YOUR DOCTOR. ° °1. A prescription for pain medication may be given to you upon discharge.  Take your pain medication as prescribed, if needed.  If narcotic pain medicine is not needed, then you may take acetaminophen (Tylenol) or ibuprofen (Advil) as needed. °2. Take your usually prescribed medications unless otherwise directed °3. If you need a refill on your pain medication, please contact your pharmacy.  They will contact our office to request authorization.  Prescriptions will not be filled after 5pm or on week-ends. °4. You should eat very light the first 24 hours after surgery, such as soup, crackers, pudding, etc.  Resume your normal diet the day after surgery. °5. Most patients will experience some swelling and bruising in the breast.  Ice packs and a good support bra will help.  Swelling and bruising can take several days to resolve.  °6. It is common to experience some constipation if taking pain medication after surgery.  Increasing fluid intake and taking a stool softener will usually help or prevent this problem from occurring.  A mild laxative (Milk of Magnesia or Miralax) should be taken according to package directions if there are no bowel movements after 48 hours. °7. Unless discharge instructions indicate otherwise, you may remove your bandages 24-48 hours after surgery, and you may shower at that time.  You may have steri-strips (small skin tapes) in place directly over the incision.  These strips should be left on the skin for 7-10 days.  If your surgeon used skin glue on the incision, you may shower in 24 hours.  The glue will flake off over the  next 2-3 weeks.  Any sutures or staples will be removed at the office during your follow-up visit. °8. ACTIVITIES:  You may resume regular daily activities (gradually increasing) beginning the next day.  Wearing a good support bra or sports bra minimizes pain and swelling.  You may have sexual intercourse when it is comfortable. °a. You may drive when you no longer are taking prescription pain medication, you can comfortably wear a seatbelt, and you can safely maneuver your car and apply brakes. °b. RETURN TO WORK:  ______________________________________________________________________________________ °9. You should see your doctor in the office for a follow-up appointment approximately two weeks after your surgery.  Your doctor’s nurse will typically make your follow-up appointment when she calls you with your pathology report.  Expect your pathology report 2-3 business days after your surgery.  You may call to check if you do not hear from us after three days. °10. OTHER INSTRUCTIONS: _______________________________________________________________________________________________ _____________________________________________________________________________________________________________________________________ °_____________________________________________________________________________________________________________________________________ °_____________________________________________________________________________________________________________________________________ ° °WHEN TO CALL YOUR DOCTOR: °1. Fever over 101.0 °2. Nausea and/or vomiting. °3. Extreme swelling or bruising. °4. Continued bleeding from incision. °5. Increased pain, redness, or drainage from the incision. ° °The clinic staff is available to answer your questions during regular business hours.  Please don’t hesitate to call and ask to speak to one of the nurses for clinical concerns.  If you have a medical emergency, go to the nearest  emergency room or call 911.  A surgeon from [facility] is always on call at the hospital. ° °For further questions, please visit [email or web address]  °

## 2020-12-05 NOTE — Anesthesia Postprocedure Evaluation (Signed)
Anesthesia Post Note  Patient: Molly Roth  Procedure(s) Performed: RIGHT BREAST LUMPECTOMY  WITH RADIOACTIVE SEED LOCALIZATION (Right Breast) LEFT BREAST LUMPECTOMY WITH RADIOACTIVE SEEDS LOCALIZATION X2 (Left Breast)     Patient location during evaluation: PACU Anesthesia Type: General Level of consciousness: awake and alert Pain management: pain level controlled Vital Signs Assessment: post-procedure vital signs reviewed and stable Respiratory status: spontaneous breathing, nonlabored ventilation, respiratory function stable and patient connected to nasal cannula oxygen Cardiovascular status: blood pressure returned to baseline and stable Postop Assessment: no apparent nausea or vomiting Anesthetic complications: no   No complications documented.  Last Vitals:  Vitals:   12/05/20 1638 12/05/20 1653  BP: 125/80 122/79  Pulse: 92 90  Resp: 13 16  Temp:  (!) 36.1 C  SpO2: 100% 99%    Last Pain:  Vitals:   12/05/20 1653  TempSrc:   PainSc: 2                  March Rummage Ramel Tobon

## 2020-12-05 NOTE — Anesthesia Preprocedure Evaluation (Addendum)
Anesthesia Evaluation  Patient identified by MRN, date of birth, ID band Patient awake    Reviewed: Allergy & Precautions, H&P , NPO status , Patient's Chart, lab work & pertinent test results  Airway Mallampati: II  TM Distance: >3 FB Neck ROM: Full    Dental no notable dental hx. (+) Teeth Intact, Dental Advisory Given   Pulmonary neg pulmonary ROS,    Pulmonary exam normal breath sounds clear to auscultation       Cardiovascular negative cardio ROS   Rhythm:Regular Rate:Normal     Neuro/Psych negative neurological ROS  negative psych ROS   GI/Hepatic negative GI ROS, Neg liver ROS,   Endo/Other  negative endocrine ROS  Renal/GU negative Renal ROS  negative genitourinary   Musculoskeletal   Abdominal   Peds  Hematology negative hematology ROS (+)   Anesthesia Other Findings   Reproductive/Obstetrics negative OB ROS                            Anesthesia Physical Anesthesia Plan  ASA: II  Anesthesia Plan: General   Post-op Pain Management:    Induction: Intravenous  PONV Risk Score and Plan: 4 or greater and Ondansetron, Dexamethasone and Midazolam  Airway Management Planned: LMA  Additional Equipment:   Intra-op Plan:   Post-operative Plan: Extubation in OR  Informed Consent: I have reviewed the patients History and Physical, chart, labs and discussed the procedure including the risks, benefits and alternatives for the proposed anesthesia with the patient or authorized representative who has indicated his/her understanding and acceptance.     Dental advisory given  Plan Discussed with: CRNA  Anesthesia Plan Comments:         Anesthesia Quick Evaluation

## 2020-12-05 NOTE — Anesthesia Procedure Notes (Signed)
Procedure Name: LMA Insertion Date/Time: 12/05/2020 3:10 PM Performed by: Eligha Bridegroom, CRNA Pre-anesthesia Checklist: Patient identified, Emergency Drugs available, Suction available, Patient being monitored and Timeout performed Patient Re-evaluated:Patient Re-evaluated prior to induction Oxygen Delivery Method: Circle system utilized Preoxygenation: Pre-oxygenation with 100% oxygen Induction Type: IV induction LMA: LMA inserted LMA Size: 4.0 Number of attempts: 1 Placement Confirmation: positive ETCO2 and breath sounds checked- equal and bilateral Tube secured with: Tape Dental Injury: Teeth and Oropharynx as per pre-operative assessment

## 2020-12-05 NOTE — Op Note (Addendum)
Pre-op Diagnosis:  Bilateral intraductal papillomas; left retroareolar intraductal mass Post-op Diagnosis: same Procedure:  Bilateral radioactive seed localized lumpectomy - two on the left, one on the right Surgeon:  Khylon Davies K. Assistant:  Carlena Hurl, PA-C - assisted with exposure and retraction Anesthesia:  GEN - LMA Indications: This is a healthy 50 year old female who recently presented with a small palpable retroareolar mass as well as some clear nipple discharge. She underwent mammogram and ultrasound. She has multiple cysts that likely represented the palpable mass. However in the left retroareolar breast at 5:00 there is a 1.4 cm intraductal mass. Ultrasound-guided biopsy was performed. However pathology revealed only normal mammary tissue. She presents now to discuss further workup and treatment. She states that she continues to see occasional discharge. She has no pain in this breast. No previous breast surgery. She does have a long history of breast cysts but fluctuate in size.  Family history of breast cancer in 2 different maternal aunts.  MRI on 07/23/20 showed a indeterminate rim enhancing cyst in the left breast, which was later biopsied under MR guidance. This revealed intraductal papilloma. The MRI also a 9 mm mass in the right lower outer quadrant at anterior depth, which was biopsied under MRI guidance. This revealed ductal papilloma, fibroadenoma, usual ductal hyperplasia. Biopsy clips were placed at all three sites.  Description of procedure: The patient is brought to the operating room placed in supine position on the operating room table. After an adequate level of general anesthesia was obtained, her anterior chest was prepped with ChloraPrep and draped in sterile fashion. A timeout was taken to ensure the proper patient and proper procedure. We began on the left side.  We interrogated the breast with the neoprobe. We made a circumareolar incision around  the lateral side of the nipple after infiltrating with 0.25% Marcaine. Dissection was carried down in the breast tissue with cautery. We used the neoprobe to guide [place] towards the radioactive seed. We excised an area of tissue around the radioactive seed 1.5 cm in diameter. The specimen was removed and was oriented with a paint kit. Specimen mammogram showed the radioactive seed as well as the biopsy clip within the specimen. This was sent for pathologic examination.   The second seed on the left is more inferior and posterior.  The neoprobe was used to guide [place] towards this area.  We excised an area of breast tissue around the radioactive seed 2 cm in diameter.  There is no residual radioactivity within the biopsy cavity. We inspected carefully for hemostasis. The wound was thoroughly irrigated.   We turned our attention to the right breast.  A circumareolar incision was made around the lateral side of the nipple.  Dissection was carried down in the breast tissue with cautery. We used the neoprobe to guide [place] towards the radioactive seed. We excised an area of tissue around the radioactive seed 2 cm in diameter. The specimen was removed and was oriented with a paint kit. Specimen mammogram showed the radioactive seed as well as the biopsy clip within the specimen. This was sent for pathologic examination. There is no residual radioactivity within the biopsy cavity. We inspected carefully for hemostasis. Both wounds were closed with a deep layer of 3-0 Vicryl and a subcuticular layer of 4-0 Monocryl. Benzoin and Steri-Strips were applied. The patient was then extubated and brought to the recovery room in stable condition. All sponge, instrument, and needle counts are correct.  Imogene Burn. Georgette Dover, MD, [facility]  Surgery  General/ Trauma Surgery  12/05/2020 4:11 PM

## 2020-12-06 ENCOUNTER — Encounter (HOSPITAL_COMMUNITY): Payer: Self-pay | Admitting: Surgery

## 2020-12-10 LAB — SURGICAL PATHOLOGY

## 2020-12-18 ENCOUNTER — Telehealth: Payer: Self-pay | Admitting: Hematology and Oncology

## 2020-12-18 NOTE — Telephone Encounter (Signed)
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 Ms. Buboltz has been cld and scheduled to see Dr. Lindi Adie on 3/29 at 345pm. Pt aware to arrive 20 minutes early.

## 2020-12-24 DIAGNOSIS — D0501 Lobular carcinoma in situ of right breast: Secondary | ICD-10-CM | POA: Insufficient documentation

## 2020-12-24 NOTE — Progress Notes (Signed)
[facility] CONSULT NOTE  Patient Care Team: Celene Squibb, MD as PCP - General (Internal Medicine)  CHIEF COMPLAINTS/PURPOSE OF CONSULTATION:  Newly diagnosed LCIS of the right breast  HISTORY OF PRESENTING ILLNESS:  Molly Roth 50 y.o. female is here because of recent diagnosis of LCIS of the right breast. Patient palpated a left breast mass for several months. Diagnostic mammogram on 02/24/20 showed a 1.6cm cyst corresponding to the palpable mass, and a 1.4cm mass at the 5 o'clock position possibly representing a papilloma. Biopsy on 03/02/20 showed no evidence of malignancy. Breast MRI on 07/23/20 following continued clear left breast nipple discharge showed a 0.9cm mass in the lower outer quadrant of the left breast, a likely benign 1.8cm cyst in the central left breast, and no lymphadenopathy. Right breast biopsy on 08/02/20 showed ductal papilloma and fibroadenoma. Left breast biopsy on 09/03/20 showed intraductal papilloma and no evidence of malignancy. She underwent bilateral lumpectomies on 12/05/20 with Dr. 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 She has a family history of breast cancer in her aunt at 26yo and her grandmother at 44yo. She presents to the clinic today for initial evaluation and discussion of treatment options.   I reviewed her records extensively and collaborated the history with the patient.  MEDICAL HISTORY:  Past Medical History:  Diagnosis Date  . High cholesterol     SURGICAL HISTORY: Past Surgical History:  Procedure Laterality Date  . BREAST CYST ASPIRATION    . BREAST LUMPECTOMY WITH RADIOACTIVE SEED AND AXILLARY LYMPH NODE DISSECTION Left 12/05/2020   Procedure: LEFT BREAST LUMPECTOMY WITH RADIOACTIVE SEEDS LOCALIZATION X2;  Surgeon: Donnie Mesa, MD;  Location: [place];  Service: General;  Laterality: Left;  .  BREAST LUMPECTOMY WITH RADIOACTIVE SEED LOCALIZATION Right 12/05/2020   Procedure: RIGHT BREAST LUMPECTOMY  WITH RADIOACTIVE SEED LOCALIZATION;  Surgeon: Donnie Mesa, MD;  Location: [place];  Service: General;  Laterality: Right;  LMA  . TUBAL LIGATION    . uterine polyp removal      SOCIAL HISTORY: Social History   Socioeconomic History  . Marital status: Married    Spouse name: Not on file  . Number of children: Not on file  . Years of education: Not on file  . Highest education level: Not on file  Occupational History  . Not on file  Tobacco Use  . Smoking status: Never Smoker  . Smokeless tobacco: Never Used  Vaping Use  . Vaping Use: Never used  Substance and Sexual Activity  . Alcohol use: No  . Drug use: No  . Sexual activity: Not on file  Other Topics Concern  . Not on file  Social History Narrative  . Not on file   Social Determinants of Health   Financial Resource Strain: Not on file  Food Insecurity: Not on file  Transportation Needs: Not on file  Physical Activity: Not on file  Stress: Not on file  Social Connections: Not on file  Intimate Partner Violence: Not on file    FAMILY HISTORY: Family History  Problem Relation Age of Onset  . Arthritis Mother   . Diabetes Father   . Cancer Sister   . Heart disease Maternal Grandmother   . Cancer Maternal Grandmother   . Breast cancer Maternal Grandmother   . Breast cancer Maternal Aunt   . Breast cancer Maternal Aunt  ALLERGIES:  is allergic to codeine.  MEDICATIONS:  Current Outpatient Medications  Medication Sig Dispense Refill  . atorvastatin (LIPITOR) 40 MG tablet Take 40 mg by mouth daily.    . cholecalciferol (VITAMIN D3) 25 MCG (1000 UNIT) tablet Take 1,000 Units by mouth daily.    . Multiple Vitamins-Minerals (MULTIVITAMIN WITH MINERALS) tablet Take 1 tablet by mouth daily.    . traMADol (ULTRAM) 50 MG tablet Take 1 tablet (50 mg total) by mouth every 6 (six) hours as needed for moderate  pain or severe pain. 20 tablet 0   No current facility-administered medications for this visit.     PHYSICAL EXAMINATION: ECOG PERFORMANCE STATUS: 1 - Symptomatic but completely ambulatory  Vitals:   12/25/20 1524  BP: 137/74  Pulse: (!) 114  Resp: 18  Temp: 98.4 F (36.9 C)  SpO2: 100%   Filed Weights   12/25/20 1524  Weight: 177 lb 8 oz (80.5 kg)      LABORATORY DATA:  I have reviewed the data as listed Lab Results  Component Value Date   WBC 5.7 11/28/2020   HGB 11.3 (L) 11/28/2020   HCT 35.2 (L) 11/28/2020   MCV 84.6 11/28/2020   PLT 351 11/28/2020   RADIOGRAPHIC STUDIES: I have personally reviewed the radiological reports and agreed with the findings in the report.  ASSESSMENT AND PLAN:  Lobular carcinoma in situ (LCIS) of right breast palpated a left breast mass, mammogram on 02/24/20 showed a 1.6cm cyst corresponding to the palpable mass, and a 1.4cm mass at the 5 o'clock position possibly representing a papilloma.  Biopsy on 03/02/20 showed no evidence of malignancy. Breast MRI on 07/23/20 following continued clear left breast nipple discharge showed a 0.9cm mass in the lower outer quadrant of the left breast, a likely benign 1.8cm cyst in the central left breast, and no lymphadenopathy.  Right breast biopsy on 08/02/20 showed ductal papilloma and fibroadenoma. Left breast biopsy on 09/03/20 showed intraductal papilloma and no evidence of malignancy.   Bilateral lumpectomies on 12/05/20 with Dr. 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  Pathology counseling: I discussed with her the difference between LCIS and invasive cancer. LCIS is a risk factor for breast cancer and not a premalignant condition. Hence it does not need to be resected. However she is at higher than normal risk of breast cancer. Risk of invasive and noninvasive breast cancer with  LCIS is 1% per year.  Based on her family history and her history of LCIS, her cumulative risk lifetime is 53%.  10-year risk is 20%.  Tamoxifen would reduce this risk by half. This is based on NSABP P-1 clinical trial  Risk reduction strategies: 1. Tamoxifen or raloxifene are indicated to decrease the risk of another noninvasive or invasive breast cancer. Patient fully understands that neither of these medications would prolong her life but certainly help decrease recurrence rate by 50% . 2. Recommended annual mammograms (to be done in October 2022) and breast MRIs (to be done April 2023).   Tamoxifen toxicities:We discussed the risks and benefits of tamoxifen. These include but not limited to insomnia, hot flashes, mood changes, vaginal dryness, and weight gain. Although rare, serious side effects including endometrial cancer, risk of blood clots were also discussed. We strongly believe that the benefits far outweigh the risks. Patient understands these risks and consented to starting treatment. Planned treatment duration is 5 years.  All questions were answered. The patient knows to call the clinic with any problems, questions or concerns.   Rulon Eisenmenger, MD, MPH 12/25/2020    I, Molly Dorshimer, am acting as scribe for Nicholas Lose, MD.  I have reviewed the above documentation for accuracy and completeness, and I agree with the above.

## 2020-12-24 NOTE — Assessment & Plan Note (Signed)
palpated a left breast mass, mammogram on 02/24/20 showed a 1.6cm cyst corresponding to the palpable mass, and a 1.4cm mass at the 5 o'clock position possibly representing a papilloma.  Biopsy on 03/02/20 showed no evidence of malignancy. Breast MRI on 07/23/20 following continued clear left breast nipple discharge showed a 0.9cm mass in the lower outer quadrant of the left breast, a likely benign 1.8cm cyst in the central left breast, and no lymphadenopathy.  Right breast biopsy on 08/02/20 showed ductal papilloma and fibroadenoma. Left breast biopsy on 09/03/20 showed intraductal papilloma and no evidence of malignancy.   Bilateral lumpectomies on 12/05/20 with Dr. 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  Pathology counseling: I discussed with her the difference between LCIS and invasive cancer. LCIS is a risk factor for breast cancer and not a premalignant condition. Hence it does not need to be resected. However she is at higher than normal risk of breast cancer. Risk of invasive and noninvasive breast cancer with LCIS is 1% per year. Her cumulative risk lifetime would be around **%. Tamoxifen would reduce this risk by half. This is based on NSABP P-1 clinical trial  Risk reduction strategies: 1. Tamoxifen or raloxifene are indicated to decrease the risk of another noninvasive or invasive breast cancer. Patient fully understands that neither of these medications would prolong her life but certainly help decrease recurrence rate by 50% . 2. Recommended annual mammograms and breast exams.   Tamoxifen toxicities:We discussed the risks and benefits of tamoxifen. These include but not limited to insomnia, hot flashes, mood changes, vaginal dryness, and weight gain. Although rare, serious side effects including endometrial cancer, risk of blood clots were also discussed. We strongly  believe that the benefits far outweigh the risks. Patient understands these risks and consented to starting treatment. Planned treatment duration is 5 years.

## 2020-12-25 ENCOUNTER — Ambulatory Visit: Payer: 59 | Admitting: Radiation Oncology

## 2020-12-25 ENCOUNTER — Ambulatory Visit: Payer: 59

## 2020-12-25 ENCOUNTER — Inpatient Hospital Stay: Payer: 59 | Attending: Hematology and Oncology | Admitting: Hematology and Oncology

## 2020-12-25 ENCOUNTER — Other Ambulatory Visit: Payer: Self-pay

## 2020-12-25 DIAGNOSIS — D0501 Lobular carcinoma in situ of right breast: Secondary | ICD-10-CM | POA: Insufficient documentation

## 2020-12-25 MED ORDER — TAMOXIFEN CITRATE 20 MG PO TABS: 20.0000 mg | ORAL_TABLET | Freq: Every day | ORAL | 3 refills | Status: DC

## 2021-01-01 ENCOUNTER — Telehealth: Payer: Self-pay | Admitting: Licensed Clinical Social Worker

## 2021-01-01 NOTE — Telephone Encounter (Signed)
[facility]  CSW called to introduce self and support services to new patient. Pt reports doing well at this time with resources and with coping/adjustment. CSW informed of supports available and provided direct contact information. No follow-up needed at this time.   Christeen Douglas, LCSW

## 2021-03-19 ENCOUNTER — Telehealth: Payer: Self-pay | Admitting: Hematology and Oncology

## 2021-03-19 NOTE — Telephone Encounter (Signed)
R/s appt per 6/21 sch msg. Pt aware.

## 2021-03-27 ENCOUNTER — Inpatient Hospital Stay: Payer: 59 | Admitting: Hematology and Oncology

## 2021-04-08 NOTE — Progress Notes (Signed)
  HEMATOLOGY-ONCOLOGY MYCHART VIDEO VISIT PROGRESS NOTE  I connected with Molly Roth on 04/09/2021 at  2:00 PM EDT by MyChart video conference and verified that I am speaking with the correct person using two identifiers.  I discussed the limitations, risks, security and privacy concerns of performing an evaluation and management service by MyChart and the availability of in person appointments.  I also discussed with the patient that there may be a patient responsible charge related to this service. The patient expressed understanding and agreed to proceed.  Patient's Location: Home Physician Location: Clinic  CHIEF COMPLIANT: Follow-up of LCIS of the right breast  INTERVAL HISTORY: Molly Roth is a 50 y.o. female with above-mentioned history of LCIS of the right breast having undergone bilateral lumpectomies and currently on antiestrogen therapy with tamoxifen. She reports via MyChart today for follow-up.    Observations/Objective:  There were no vitals filed for this visit. There is no height or weight on file to calculate BMI.  I have reviewed the data as listed No flowsheet data found.  Lab Results  Component Value Date   WBC 5.7 11/28/2020   HGB 11.3 (L) 11/28/2020   HCT 35.2 (L) 11/28/2020   MCV 84.6 11/28/2020   PLT 351 11/28/2020      Assessment Plan:  Lobular carcinoma in situ (LCIS) of right breast Bilateral lumpectomies on 12/05/20 with Dr. 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  Risk reduction strategies: Tamoxifen started 12/25/2020 (10 mg daily)  Tamoxifen toxicities: Hot flashes Joint pains: These appear to be bothering her but she is willing to continue with a half a tablet of tamoxifen daily.  Breast cancer surveillance: annual mammograms (to be done in October 2022) and breast MRIs (to be done April  2023).  Return to clinic in 1 year for a MyChart virtual visit  I discussed the assessment and treatment plan with the patient. The patient was provided an opportunity to ask questions and all were answered. The patient agreed with the plan and demonstrated an understanding of the instructions. The patient was advised to call back or seek an in-person evaluation if the symptoms worsen or if the condition fails to improve as anticipated.   Total time spent: 20 minutes including face-to-face MyChart video visit time and time spent for planning, charting and coordination of care  Rulon Eisenmenger, MD 04/09/2021  I, Thana Ates am acting as scribe for Nicholas Lose, MD.  I have reviewed the above documentation for accuracy and completeness, and I agree with the above.

## 2021-04-09 ENCOUNTER — Inpatient Hospital Stay: Payer: 59 | Attending: Hematology and Oncology | Admitting: Hematology and Oncology

## 2021-04-09 DIAGNOSIS — D0501 Lobular carcinoma in situ of right breast: Secondary | ICD-10-CM

## 2021-04-09 NOTE — Assessment & Plan Note (Signed)
Bilateral lumpectomies on 12/05/20 with Dr. 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  Risk reduction strategies: Tamoxifen started 12/25/2020  Tamoxifen toxicities:  Breast cancer surveillance: annual mammograms (to be done in October 2022) and breast MRIs (to be done April 2023).

## 2021-05-01 ENCOUNTER — Encounter: Payer: Self-pay | Admitting: Internal Medicine

## 2021-07-30 ENCOUNTER — Other Ambulatory Visit: Payer: Self-pay

## 2021-07-30 ENCOUNTER — Ambulatory Visit
Admission: RE | Admit: 2021-07-30 | Discharge: 2021-07-30 | Disposition: A | Discharge status: Home / Self Care | Payer: 59 | Source: Ambulatory Visit | Attending: Hematology and Oncology | Admitting: Hematology and Oncology

## 2021-07-30 DIAGNOSIS — D0501 Lobular carcinoma in situ of right breast: Secondary | ICD-10-CM

## 2021-08-21 ENCOUNTER — Encounter: Payer: Self-pay | Admitting: Gastroenterology

## 2021-08-21 ENCOUNTER — Ambulatory Visit (INDEPENDENT_AMBULATORY_CARE_PROVIDER_SITE_OTHER): Payer: 59 | Admitting: Gastroenterology

## 2021-08-21 ENCOUNTER — Other Ambulatory Visit: Payer: Self-pay

## 2021-08-21 DIAGNOSIS — Z1211 Encounter for screening for malignant neoplasm of colon: Secondary | ICD-10-CM

## 2021-08-21 NOTE — Patient Instructions (Signed)
Colonoscopy to be scheduled once 09/2021 schedule becomes available.

## 2021-08-21 NOTE — Progress Notes (Signed)
Primary Care Physician:  Celene Squibb, MD  Primary Gastroenterologist:  Garfield Cornea, MD    Chief Complaint  Patient presents with   Consult    TCS never done prior. No FH colon cancer/polyps    HPI:  Molly Roth is a 50 y.o. female here at the request of Dr. Nevada Crane for consideration of screening colonoscopy.    Patient states she is never had a colonoscopy.  No family history of colon polyps or colon cancer.  Denies any GI issues.  Bowel movements are regular.  No blood in the stool or melena.  No abdominal pain.  No upper GI symptoms.  History of chronic mild anemia (hemoglobin of 10.4, MCV 84 in July 2022) which she has contributed to heavy menses.  Current Outpatient Medications  Medication Sig Dispense Refill   atorvastatin (LIPITOR) 40 MG tablet Take 40 mg by mouth daily.     Omega-3 Fatty Acids (OMEGA-3 FISH OIL PO) Take by mouth daily.     tamoxifen (NOLVADEX) 20 MG tablet Take 1 tablet (20 mg total) by mouth daily. 90 tablet 3   No current facility-administered medications for this visit.    Allergies as of 08/21/2021 - Review Complete 08/21/2021  Allergen Reaction Noted   Codeine Rash 03/31/2016    Past Medical History:  Diagnosis Date   Breast cancer ([place]) 11/2020   High cholesterol    Leiomyoma     Past Surgical History:  Procedure Laterality Date   BREAST CYST ASPIRATION     BREAST LUMPECTOMY WITH RADIOACTIVE SEED AND AXILLARY LYMPH NODE DISSECTION Left 12/05/2020   Procedure: LEFT BREAST LUMPECTOMY WITH RADIOACTIVE SEEDS LOCALIZATION X2;  Surgeon: Donnie Mesa, MD;  Location: [place];  Service: General;  Laterality: Left;   BREAST LUMPECTOMY WITH RADIOACTIVE SEED LOCALIZATION Right 12/05/2020   Procedure: RIGHT BREAST LUMPECTOMY  WITH RADIOACTIVE SEED LOCALIZATION;  Surgeon: Donnie Mesa, MD;  Location: [place];  Service: General;  Laterality: Right;  LMA   CESAREAN SECTION     TUBAL LIGATION     uterine polyp removal      Family History   Problem Relation Age of Onset   Arthritis Mother    Diabetes Father    Cancer Sister        childhood cancer, histiocytosis   Heart disease Maternal Grandmother    Cancer Maternal Grandmother    Breast cancer Maternal Grandmother    Breast cancer Maternal Aunt    Breast cancer Maternal Aunt    Colon cancer Neg Hx    Colon polyps Neg Hx     Social History   Socioeconomic History   Marital status: Married    Spouse name: Not on file   Number of children: Not on file   Years of education: Not on file   Highest education level: Not on file  Occupational History   Not on file  Tobacco Use   Smoking status: Never   Smokeless tobacco: Never  Vaping Use   Vaping Use: Never used  Substance and Sexual Activity   Alcohol use: No   Drug use: No   Sexual activity: Not on file  Other Topics Concern   Not on file  Social History Narrative   Not on file   Social Determinants of Health   Financial Resource Strain: Not on file  Food Insecurity: Not on file  Transportation Needs: Not on file  Physical Activity: Not on file  Stress: Not on file  Social Connections: Not on file  Intimate Partner Violence: Not on file      ROS:  General: Negative for anorexia, weight loss, fever, chills, fatigue, weakness. Eyes: Negative for vision changes.  ENT: Negative for hoarseness, difficulty swallowing , nasal congestion. CV: Negative for chest pain, angina, palpitations, dyspnea on exertion, peripheral edema.  Respiratory: Negative for dyspnea at rest, dyspnea on exertion, cough, sputum, wheezing.  GI: See history of present illness. GU:  Negative for dysuria, hematuria, urinary incontinence, urinary frequency, nocturnal urination.  MS: Negative for joint pain, low back pain.  Derm: Negative for rash or itching.  Neuro: Negative for weakness, abnormal sensation, seizure, frequent headaches, memory loss, confusion.  Psych: Negative for anxiety, depression, suicidal ideation,  hallucinations.  Endo: Negative for unusual weight change.  Heme: Negative for bruising or bleeding. Allergy: Negative for rash or hives.    Physical Examination:  BP (!) 141/76   Pulse (!) 115   Temp (!) 97.3 F (36.3 C)   Ht 5\' 4"  (1.626 m)   Wt 168 lb 6.4 oz (76.4 kg)   LMP 08/19/2021   BMI 28.91 kg/m    General: Well-nourished, well-developed in no acute distress.  Head: Normocephalic, atraumatic.   Eyes: Conjunctiva pink, no icterus. Mouth: masked Neck: Supple without thyromegaly, masses, or lymphadenopathy.  Lungs: Clear to auscultation bilaterally.  Heart: Regular rate and rhythm, no murmurs rubs or gallops.  Abdomen: Bowel sounds are normal, nontender, nondistended, no hepatosplenomegaly or masses, no abdominal bruits or    hernia , no rebound or guarding.   Rectal: not performed Extremities: No lower extremity edema. No clubbing or deformities.  Neuro: Alert and oriented x 4 , grossly normal neurologically.  Skin: Warm and dry, no rash or jaundice.   Psych: Alert and cooperative, normal mood and affect.  Labs: See hpi  Imaging Studies: MM DIAG BREAST TOMO BILATERAL  Result Date: 07/30/2021 CLINICAL DATA:  50 year old female presenting for annual exam. History of bilateral excisional biopsies demonstrating left breast intraductal papilloma and right breast complex sclerosing lesion with LCIS. EXAM: DIGITAL DIAGNOSTIC BILATERAL MAMMOGRAM WITH TOMOSYNTHESIS AND CAD TECHNIQUE: Bilateral digital diagnostic mammography and breast tomosynthesis was performed. The images were evaluated with computer-aided detection. COMPARISON:  Previous exam(s). ACR Breast Density Category c: The breast tissue is heterogeneously dense, which may obscure small masses. FINDINGS: Right breast: Spot 2D magnification views were performed in addition to standard views. There are grouped calcifications in the outer right breast which layer on lateral view consistent with benign milk of calcium. No  suspicious mass, distortion, or microcalcifications are identified to suggest presence of malignancy. Left breast: No suspicious mass, distortion, or microcalcifications are identified to suggest presence of malignancy. IMPRESSION: No mammographic evidence of malignancy bilaterally. RECOMMENDATION: 1.  Screening mammogram in one year.(Code:SM-B-01Y) 2. The American Cancer Society recommends annual MRI and mammography in patients with an estimated lifetime risk of developing breast cancer greater than 20 - 25%, or who are known or suspected to be positive for the breast cancer gene. I have discussed the findings and recommendations with the patient. If applicable, a reminder letter will be sent to the patient regarding the next appointment. BI-RADS CATEGORY  2: Benign. Electronically Signed   By: Audie Pinto M.D.   On: 07/30/2021 10:38     Assessment/Plan:  50 year old female presenting to schedule first-ever colonoscopy.  Denies any GI symptoms.  No family history of colon cancer or colon polyps.  Plan for colonoscopy in the near future with propofol. ASA I.  I have discussed the  risks, alternatives, benefits with regards to but not limited to the risk of reaction to medication, bleeding, infection, perforation and the patient is agreeable to proceed. Written consent to be obtained.  Patient requesting colonoscopy in January as her new insurance which has better coverage starts September 29, 2021.

## 2021-09-24 ENCOUNTER — Encounter: Payer: Self-pay | Admitting: *Deleted

## 2021-10-14 ENCOUNTER — Encounter: Payer: Self-pay | Admitting: Internal Medicine

## 2021-10-21 DIAGNOSIS — E782 Mixed hyperlipidemia: Secondary | ICD-10-CM | POA: Diagnosis not present

## 2021-10-21 DIAGNOSIS — D509 Iron deficiency anemia, unspecified: Secondary | ICD-10-CM | POA: Diagnosis not present

## 2021-10-28 DIAGNOSIS — N951 Menopausal and female climacteric states: Secondary | ICD-10-CM | POA: Diagnosis not present

## 2021-10-28 DIAGNOSIS — D259 Leiomyoma of uterus, unspecified: Secondary | ICD-10-CM | POA: Diagnosis not present

## 2021-10-28 DIAGNOSIS — D509 Iron deficiency anemia, unspecified: Secondary | ICD-10-CM | POA: Diagnosis not present

## 2021-10-28 DIAGNOSIS — E782 Mixed hyperlipidemia: Secondary | ICD-10-CM | POA: Diagnosis not present

## 2021-10-29 DIAGNOSIS — D649 Anemia, unspecified: Secondary | ICD-10-CM | POA: Diagnosis not present

## 2021-10-29 DIAGNOSIS — D259 Leiomyoma of uterus, unspecified: Secondary | ICD-10-CM | POA: Diagnosis not present

## 2021-11-13 DIAGNOSIS — N920 Excessive and frequent menstruation with regular cycle: Secondary | ICD-10-CM | POA: Diagnosis not present

## 2021-11-13 DIAGNOSIS — N924 Excessive bleeding in the premenopausal period: Secondary | ICD-10-CM | POA: Diagnosis not present

## 2021-11-20 DIAGNOSIS — I5021 Acute systolic (congestive) heart failure: Secondary | ICD-10-CM | POA: Diagnosis not present

## 2021-11-20 DIAGNOSIS — I10 Essential (primary) hypertension: Secondary | ICD-10-CM | POA: Diagnosis not present

## 2021-11-20 DIAGNOSIS — R0602 Shortness of breath: Secondary | ICD-10-CM | POA: Diagnosis not present

## 2021-11-20 DIAGNOSIS — K219 Gastro-esophageal reflux disease without esophagitis: Secondary | ICD-10-CM | POA: Diagnosis not present

## 2021-11-21 DIAGNOSIS — R0602 Shortness of breath: Secondary | ICD-10-CM | POA: Diagnosis not present

## 2021-11-21 DIAGNOSIS — K219 Gastro-esophageal reflux disease without esophagitis: Secondary | ICD-10-CM | POA: Diagnosis not present

## 2021-11-21 DIAGNOSIS — I5021 Acute systolic (congestive) heart failure: Secondary | ICD-10-CM | POA: Diagnosis not present

## 2021-11-21 DIAGNOSIS — I10 Essential (primary) hypertension: Secondary | ICD-10-CM | POA: Diagnosis not present

## 2021-11-22 DIAGNOSIS — I10 Essential (primary) hypertension: Secondary | ICD-10-CM | POA: Diagnosis not present

## 2021-11-22 DIAGNOSIS — I5021 Acute systolic (congestive) heart failure: Secondary | ICD-10-CM | POA: Diagnosis not present

## 2021-11-22 DIAGNOSIS — K219 Gastro-esophageal reflux disease without esophagitis: Secondary | ICD-10-CM | POA: Diagnosis not present

## 2021-11-22 DIAGNOSIS — R0602 Shortness of breath: Secondary | ICD-10-CM | POA: Diagnosis not present

## 2021-12-02 ENCOUNTER — Other Ambulatory Visit: Payer: Self-pay | Admitting: Obstetrics and Gynecology

## 2021-12-02 ENCOUNTER — Ambulatory Visit: Payer: 59

## 2021-12-02 DIAGNOSIS — N8003 Adenomyosis of the uterus: Secondary | ICD-10-CM | POA: Diagnosis not present

## 2021-12-02 DIAGNOSIS — N83202 Unspecified ovarian cyst, left side: Secondary | ICD-10-CM | POA: Diagnosis not present

## 2021-12-02 DIAGNOSIS — D251 Intramural leiomyoma of uterus: Secondary | ICD-10-CM | POA: Diagnosis not present

## 2021-12-02 DIAGNOSIS — D259 Leiomyoma of uterus, unspecified: Secondary | ICD-10-CM | POA: Diagnosis not present

## 2021-12-02 DIAGNOSIS — N840 Polyp of corpus uteri: Secondary | ICD-10-CM | POA: Diagnosis not present

## 2021-12-02 DIAGNOSIS — N920 Excessive and frequent menstruation with regular cycle: Secondary | ICD-10-CM | POA: Diagnosis not present

## 2021-12-02 DIAGNOSIS — N83201 Unspecified ovarian cyst, right side: Secondary | ICD-10-CM | POA: Diagnosis not present

## 2022-01-02 DIAGNOSIS — D649 Anemia, unspecified: Secondary | ICD-10-CM | POA: Diagnosis not present

## 2022-01-09 ENCOUNTER — Ambulatory Visit (INDEPENDENT_AMBULATORY_CARE_PROVIDER_SITE_OTHER): Payer: Self-pay | Admitting: *Deleted

## 2022-01-09 VITALS — Ht 63.0 in | Wt 170.0 lb

## 2022-01-09 DIAGNOSIS — Z1211 Encounter for screening for malignant neoplasm of colon: Secondary | ICD-10-CM

## 2022-01-09 NOTE — Progress Notes (Addendum)
Gastroenterology Pre-Procedure Review ? ?Request Date: 01/09/2022 ?Requesting Physician: Triage per ov note 08/21/2021, no previous TCS ? ?PATIENT REVIEW QUESTIONS: The patient responded to the following health history questions as indicated:   ? ?1. Diabetes Melitis: no ?2. Joint replacements in the past 12 months: no ?3. Major health problems in the past 3 months: yes, had hysterectomy 12/02/2021 ?4. Has an artificial valve or MVP: no ?5. Has a defibrillator: no ?6. Has been advised in past to take antibiotics in advance of a procedure like teeth cleaning: no ?7. Family history of colon cancer: no  ?8. Alcohol Use: no ?9. Illicit drug Use: no ?10. History of sleep apnea: no  ?11. History of coronary artery or other vascular stents placed within the last 12 months: no ?12. History of any prior anesthesia complications: no ?13. Body mass index is 30.11 kg/m?. ?   ?MEDICATIONS & ALLERGIES:    ?Patient reports the following regarding taking any blood thinners:   ?Plavix? no ?Aspirin? no ?Coumadin? no ?Brilinta? no ?Xarelto? no ?Eliquis? no ?Pradaxa? no ?Savaysa? no ?Effient? no ? ?Patient confirms/reports the following medications:  ?Current Outpatient Medications  ?Medication Sig Dispense Refill  ? atorvastatin (LIPITOR) 40 MG tablet Take 40 mg by mouth daily.    ? Iron-FA-B Cmp-C-Biot-Probiotic (FUSION PLUS) CAPS Take 1 capsule by mouth daily.    ? Omega-3 Fatty Acids (OMEGA-3 FISH OIL PO) Take by mouth daily.    ? tamoxifen (NOLVADEX) 20 MG tablet Take 1 tablet (20 mg total) by mouth daily. 90 tablet 3  ? ?No current facility-administered medications for this visit.  ? ? ?Patient confirms/reports the following allergies:  ?Allergies  ?Allergen Reactions  ? Codeine Rash  ? ? ?No orders of the defined types were placed in this encounter. ? ? ?AUTHORIZATION INFORMATION ?Primary Insurance: [place],  [place] #: [id number],  Group #: [id number] ?Pre-Cert / Josem Kaufmann required: No, not required ? ?SCHEDULE INFORMATION: ?Procedure has been  scheduled as follows:  ?Date: 01/27/2022, Time: 1:15  ?Location: [facility] with Dr. Gala Romney ? ?This Gastroenterology Pre-Precedure Review Form is being routed to the following provider(s): Venetia Night, NP ?  ?

## 2022-01-10 NOTE — Progress Notes (Signed)
Appropriate to schedule. ASA 2.  ?

## 2022-01-13 ENCOUNTER — Encounter: Payer: Self-pay | Admitting: *Deleted

## 2022-01-13 MED ORDER — NA SULFATE-K SULFATE-MG SULF 17.5-3.13-1.6 GM/177ML PO SOLN: 1.0000 | Freq: Once | ORAL | 0 refills | Status: AC

## 2022-01-13 NOTE — Addendum Note (Signed)
Addended by: Metro Kung on: 01/13/2022 09:51 AM ? ? Modules accepted: Orders ? ?

## 2022-01-13 NOTE — Progress Notes (Signed)
Will pt need to hold Iron 10 days prior to procedure? ?

## 2022-01-13 NOTE — Progress Notes (Signed)
Spoke to pt.  Scheduled procedure for 01/27/2022 at 1:15, arrival at 11:45 at [facility].  Reviewed prep instructions with pt by phone. Pt aware to pick up prep kit at pharmacy.  Pt aware to hold Iron 10 days prior to procedure date.  She is aware that I am mailing out instructions. ?

## 2022-01-27 ENCOUNTER — Ambulatory Visit (HOSPITAL_COMMUNITY): Payer: BC Managed Care – PPO | Admitting: Anesthesiology

## 2022-01-27 ENCOUNTER — Ambulatory Visit (HOSPITAL_COMMUNITY)
Admission: RE | Admit: 2022-01-27 | Discharge: 2022-01-27 | Disposition: A | Discharge status: Home / Self Care | Payer: BC Managed Care – PPO | Attending: Internal Medicine | Admitting: Internal Medicine

## 2022-01-27 ENCOUNTER — Other Ambulatory Visit: Payer: Self-pay

## 2022-01-27 ENCOUNTER — Encounter (HOSPITAL_COMMUNITY): Payer: Self-pay | Admitting: Internal Medicine

## 2022-01-27 ENCOUNTER — Encounter (HOSPITAL_COMMUNITY)
Admission: RE | Disposition: A | Discharge status: Home / Self Care | Payer: Self-pay | Source: Home / Self Care | Attending: Internal Medicine

## 2022-01-27 DIAGNOSIS — Z1211 Encounter for screening for malignant neoplasm of colon: Secondary | ICD-10-CM | POA: Insufficient documentation

## 2022-01-27 HISTORY — PX: COLONOSCOPY WITH PROPOFOL: SHX5780

## 2022-01-27 SURGERY — COLONOSCOPY WITH PROPOFOL
Anesthesia: General

## 2022-01-27 MED ORDER — PROPOFOL 10 MG/ML IV BOLUS
INTRAVENOUS | Status: DC | PRN
  Administered 2022-01-27 (×2): 50 mg via INTRAVENOUS
  Administered 2022-01-27: 100 mg via INTRAVENOUS

## 2022-01-27 MED ORDER — LIDOCAINE HCL (CARDIAC) PF 100 MG/5ML IV SOSY
PREFILLED_SYRINGE | INTRAVENOUS | Status: DC | PRN
  Administered 2022-01-27: 50 mg via INTRAVENOUS

## 2022-01-27 MED ORDER — LACTATED RINGERS IV SOLN
INTRAVENOUS | Status: DC
Start: 2022-01-27 — End: 2022-01-27

## 2022-01-27 MED ORDER — PROPOFOL 500 MG/50ML IV EMUL
INTRAVENOUS | Status: DC | PRN
  Administered 2022-01-27: 150 ug/kg/min via INTRAVENOUS

## 2022-01-27 MED ORDER — STERILE WATER FOR IRRIGATION IR SOLN
Status: DC | PRN
  Administered 2022-01-27: 60 mL

## 2022-01-27 NOTE — Anesthesia Preprocedure Evaluation (Signed)

## 2022-01-27 NOTE — Op Note (Signed)
[facility] ?Patient Name: Molly Roth ?Procedure Date: 01/27/2022 11:58 AM ?MRN: [id number] ?Date of Birth: [birth year] ?Attending MD: Norvel Richards , MD ?CSN: [id number] ?Age: 51 ?Admit Type: Outpatient ?Procedure:                Colonoscopy ?Indications:              Screening for colorectal malignant neoplasm ?Providers:                Norvel Richards, MD, Caprice Kluver, Suzan Garibaldi.  ?                          Risa Grill, Technician ?Referring MD:              ?Medicines:                Propofol per Anesthesia ?Complications:            No immediate complications. ?Estimated Blood Loss:     Estimated blood loss: none. ?Procedure:                Pre-Anesthesia Assessment: ?                          - Prior to the procedure, a History and Physical  ?                          was performed, and patient medications and  ?                          allergies were reviewed. The patient's tolerance of  ?                          previous anesthesia was also reviewed. The risks  ?                          and benefits of the procedure and the sedation  ?                          options and risks were discussed with the patient.  ?                          All questions were answered, and informed consent  ?                          was obtained. Prior Anticoagulants: The patient has  ?                          taken no previous anticoagulant or antiplatelet  ?                          agents. ASA Grade Assessment: II - A patient with  ?                          mild systemic disease. After reviewing the risks  ?  and benefits, the patient was deemed in  ?                          satisfactory condition to undergo the procedure. ?                          After obtaining informed consent, the colonoscope  ?                          was passed under direct vision. Throughout the  ?                          procedure, the patient's blood pressure, pulse, and  ?                           oxygen saturations were monitored continuously. The  ?                          [id number]) scope was introduced through the  ?                          anus and advanced to the the cecum, identified by  ?                          appendiceal orifice and ileocecal valve. The  ?                          colonoscopy was performed without difficulty. The  ?                          patient tolerated the procedure well. The quality  ?                          of the bowel preparation was adequate. ?Scope In: 12:48:26 PM ?Scope Out: 1:00:23 PM ?Scope Withdrawal Time: 0 hours 7 minutes 32 seconds  ?Total Procedure Duration: 0 hours 11 minutes 57 seconds  ?Findings: ?     The perianal and digital rectal examinations were normal. ?     The colon (entire examined portion) appeared normal. ?     The retroflexed view of the distal rectum and anal verge was normal and  ?     showed no anal or rectal abnormalities. ?Impression:               - The entire examined colon is normal. ?                          - The distal rectum and anal verge are normal on  ?                          retroflexion view. ?                          - No specimens collected. ?Moderate Sedation: ?     Moderate (conscious) sedation was personally administered by an  ?     anesthesia professional.  The following parameters were monitored: oxygen  ?     saturation, heart rate, blood pressure, respiratory rate, EKG, adequacy  ?     of pulmonary ventilation, and response to care. ?Recommendation:           - Patient has a contact number available for  ?                          emergencies. The signs and symptoms of potential  ?                          delayed complications were discussed with the  ?                          patient. Return to normal activities tomorrow.  ?                          Written discharge instructions were provided to the  ?                          patient. ?                          - Advance diet as tolerated. ?                           - Continue present medications. ?                          - Repeat colonoscopy in 10 years for screening  ?                          purposes. ?                          - Return to GI office (date not yet determined). ?Procedure Code(s):        --- Professional --- ?                          [phone], Colonoscopy, flexible; diagnostic, including  ?                          collection of specimen(s) by brushing or washing,  ?                          when performed (separate procedure) ?Diagnosis Code(s):        --- Professional --- ?                          Z12.11, Encounter for screening for malignant  ?                          neoplasm of colon ?CPT copyright 2019 American Medical Association. All rights reserved. ?The codes documented in this report are preliminary and upon coder review may  ?be revised to meet current compliance requirements. ?Cristopher Estimable. Creston Klas, MD ?Norvel Richards, MD ?01/27/2022 1:18:03 PM ?This report has  been signed electronically. ?Number of Addenda: 0 ?

## 2022-01-27 NOTE — Transfer of Care (Signed)
Immediate Anesthesia Transfer of Care Note ? ?Patient: Molly Roth ? ?Procedure(s) Performed: COLONOSCOPY WITH PROPOFOL ? ?Patient Location: Endoscopy Unit ? ?Anesthesia Type:General ? ?Level of Consciousness: awake ? ?Airway & Oxygen Therapy: Patient Spontanous Breathing ? ?Post-op Assessment: Report given to RN and Post -op Vital signs reviewed and stable ? ?Post vital signs: Reviewed and stable ? ?Last Vitals:  ?Vitals Value Taken Time  ?BP    ?Temp    ?Pulse    ?Resp    ?SpO2    ? ? ?Last Pain:  ?Vitals:  ? 01/27/22 1244  ?TempSrc:   ?PainSc: 0-No pain  ?   ? ?Patients Stated Pain Goal: 5 (01/27/22 1202) ? ?Complications: No notable events documented. ?

## 2022-01-27 NOTE — Anesthesia Procedure Notes (Signed)
Date/Time: 01/27/2022 12:48 PM ?Performed by: Orlie Dakin, CRNA ?Pre-anesthesia Checklist: Patient identified, Emergency Drugs available, Suction available and Patient being monitored ?Patient Re-evaluated:Patient Re-evaluated prior to induction ?Oxygen Delivery Method: Nasal cannula ?Induction Type: IV induction ?Placement Confirmation: positive ETCO2 ? ? ? ? ?

## 2022-01-27 NOTE — Discharge Instructions (Signed)
?  Colonoscopy ?Discharge Instructions ? ?Read the instructions outlined below and refer to this sheet in the next few weeks. These discharge instructions provide you with general information on caring for yourself after you leave the hospital. Your doctor may also give you specific instructions. While your treatment has been planned according to the most current medical practices available, unavoidable complications occasionally occur. If you have any problems or questions after discharge, call Dr. Gala Romney at [phone]. ?ACTIVITY ?You may resume your regular activity, but move at a slower pace for the next 24 hours.  ?Take frequent rest periods for the next 24 hours.  ?Walking will help get rid of the air and reduce the bloated feeling in your belly (abdomen).  ?No driving for 24 hours (because of the medicine (anesthesia) used during the test).   ?Do not sign any important legal documents or operate any machinery for 24 hours (because of the anesthesia used during the test).  ?NUTRITION ?Drink plenty of fluids.  ?You may resume your normal diet as instructed by your doctor.  ?Begin with a light meal and progress to your normal diet. Heavy or fried foods are harder to digest and may make you feel sick to your stomach (nauseated).  ?Avoid alcoholic beverages for 24 hours or as instructed.  ?MEDICATIONS ?You may resume your normal medications unless your doctor tells you otherwise.  ?WHAT YOU CAN EXPECT TODAY ?Some feelings of bloating in the abdomen.  ?Passage of more gas than usual.  ?Spotting of blood in your stool or on the toilet paper.  ?IF YOU HAD POLYPS REMOVED DURING THE COLONOSCOPY: ?No aspirin products for 7 days or as instructed.  ?No alcohol for 7 days or as instructed.  ?Eat a soft diet for the next 24 hours.  ?FINDING OUT THE RESULTS OF YOUR TEST ?Not all test results are available during your visit. If your test results are not back during the visit, make an appointment with your caregiver to find out the  results. Do not assume everything is normal if you have not heard from your caregiver or the medical facility. It is important for you to follow up on all of your test results.  ?SEEK IMMEDIATE MEDICAL ATTENTION IF: ?You have more than a spotting of blood in your stool.  ?Your belly is swollen (abdominal distention).  ?You are nauseated or vomiting.  ?You have a temperature over 101.  ?You have abdominal pain or discomfort that is severe or gets worse throughout the day.   ? ?Your colonoscopy was normal. ? ?It is recommended you return for repeat colonoscopy in 10 years. ? ?At patient request, I called Petrice Beedy at [phone] findings and recommendations ?

## 2022-01-27 NOTE — H&P (Signed)
$'@LOGO'd$ @ ? ? ?Primary Care Physician:  Celene Squibb, MD ?Primary Gastroenterologist:  Dr. Gala Romney ? ?Pre-Procedure History & Physical: ?HPI:  Molly Roth is a 51 y.o. female is here for a screening colonoscopy.  No bowel symptoms or family history of colon cancer.  No prior colonoscopy. ? ?Past Medical History:  ?Diagnosis Date  ? Breast cancer ([place]) 11/2020  ? High cholesterol   ? Leiomyoma   ? ? ?Past Surgical History:  ?Procedure Laterality Date  ? BREAST CYST ASPIRATION    ? BREAST LUMPECTOMY WITH RADIOACTIVE SEED AND AXILLARY LYMPH NODE DISSECTION Left 12/05/2020  ? Procedure: LEFT BREAST LUMPECTOMY WITH RADIOACTIVE SEEDS LOCALIZATION X2;  Surgeon: Donnie Mesa, MD;  Location: [facility];  Service: General;  Laterality: Left;  ? BREAST LUMPECTOMY WITH RADIOACTIVE SEED LOCALIZATION Right 12/05/2020  ? Procedure: RIGHT BREAST LUMPECTOMY  WITH RADIOACTIVE SEED LOCALIZATION;  Surgeon: Donnie Mesa, MD;  Location: [facility];  Service: General;  Laterality: Right;  LMA  ? CESAREAN SECTION    ? TUBAL LIGATION    ? uterine polyp removal    ? ? ?Prior to Admission medications   ?Medication Sig Start Date End Date Taking? Authorizing Provider  ?atorvastatin (LIPITOR) 40 MG tablet Take 40 mg by mouth daily.   Yes [provider]  ?Iron-FA-B Cmp-C-Biot-Probiotic (FUSION PLUS) CAPS Take 1 capsule by mouth daily. 01/06/22  Yes [provider]  ?tamoxifen (NOLVADEX) 20 MG tablet Take 1 tablet (20 mg total) by mouth daily. 12/25/20  Yes Nicholas Lose, MD  ? ? ?Allergies as of 01/13/2022 - Review Complete 01/09/2022  ?Allergen Reaction Noted  ? Codeine Rash 03/31/2016  ? ? ?Family History  ?Problem Relation Age of Onset  ? Arthritis Mother   ? Diabetes Father   ? Cancer Sister   ?     childhood cancer, histiocytosis  ? Heart disease Maternal Grandmother   ? Cancer Maternal Grandmother   ? Breast cancer Maternal Grandmother   ? Breast cancer Maternal Aunt   ? Breast cancer Maternal Aunt   ? Colon cancer Neg Hx    ? Colon polyps Neg Hx   ? ? ?Social History  ? ?Socioeconomic History  ? Marital status: Married  ?  Spouse name: Not on file  ? Number of children: Not on file  ? Years of education: Not on file  ? Highest education level: Not on file  ?Occupational History  ? Not on file  ?Tobacco Use  ? Smoking status: Never  ? Smokeless tobacco: Never  ?Vaping Use  ? Vaping Use: Never used  ?Substance and Sexual Activity  ? Alcohol use: No  ? Drug use: No  ? Sexual activity: Not on file  ?Other Topics Concern  ? Not on file  ?Social History Narrative  ? Not on file  ? ?Social Determinants of Health  ? ?Financial Resource Strain: Not on file  ?Food Insecurity: Not on file  ?Transportation Needs: Not on file  ?Physical Activity: Not on file  ?Stress: Not on file  ?Social Connections: Not on file  ?Intimate Partner Violence: Not on file  ? ? ?Review of Systems: ?See HPI, otherwise negative ROS ? ?Physical Exam: ?BP (!) 157/87   Pulse (!) 113   Temp 98.5 ?F (36.9 ?C) (Oral)   Resp 19   Ht '5\' 3"'$  (1.6 m)   Wt 74.8 kg   LMP 08/19/2021   SpO2 97%   BMI 29.23 kg/m?  ?General:   Alert,  Well-developed, well-nourished, pleasant  and cooperative in NAD ?Lungs:  Clear throughout to auscultation.   No wheezes, crackles, or rhonchi. No acute distress. ?Heart:  Regular rate and rhythm; no murmurs, clicks, rubs,  or gallops. ?Abdomen:  Soft, nontender and nondistended. No masses, hepatosplenomegaly or hernias noted. Normal bowel sounds, without guarding, and without rebound.   ? ?Impression/Plan: ?Molly Roth is now here to undergo a screening colonoscopy.  First ever average risk screening examination ? ?Risks, benefits, limitations, imponderables and alternatives regarding colonoscopy have been reviewed with the patient. Questions have been answered. All parties agreeable.  ? ? ? ?Notice:  This dictation was prepared with Dragon dictation along with smaller phrase technology. Any transcriptional errors that result from  this process are unintentional and may not be corrected upon review.  ? ?

## 2022-01-28 NOTE — Anesthesia Postprocedure Evaluation (Signed)
Anesthesia Post Note ? ?Patient: Havannah Streat Jolin ? ?Procedure(s) Performed: COLONOSCOPY WITH PROPOFOL ? ?Patient location during evaluation: Phase II ?Anesthesia Type: General ?Level of consciousness: awake ?Pain management: pain level controlled ?Vital Signs Assessment: post-procedure vital signs reviewed and stable ?Respiratory status: spontaneous breathing and respiratory function stable ?Cardiovascular status: blood pressure returned to baseline and stable ?Postop Assessment: no headache and no apparent nausea or vomiting ?Anesthetic complications: no ?Comments: Late entry ? ? ?No notable events documented. ? ? ?Last Vitals:  ?Vitals:  ? 01/27/22 1303 01/27/22 1306  ?BP: 93/63 100/67  ?Pulse: 98   ?Resp: 20   ?Temp:    ?SpO2: 97% 99%  ?  ?Last Pain:  ?Vitals:  ? 01/27/22 1303  ?TempSrc: Oral  ?PainSc: 0-No pain  ? ? ?  ?  ?  ?  ?  ?  ? ?Louann Sjogren ? ? ? ? ?

## 2022-02-03 ENCOUNTER — Encounter (HOSPITAL_COMMUNITY): Payer: Self-pay | Admitting: Internal Medicine

## 2022-02-04 IMAGING — US US BREAST BX W LOC DEV 1ST LESION IMG BX SPEC US GUIDE*L*
1 series · 7 of 7 positions shown · non-contrast
Comparison: Previous exam(s).
COMPARISON: Previous exam(s).

Addendum:
CLINICAL DATA: Patient with indeterminate intraductal left breast
mass 5 o'clock position.

EXAM:
ULTRASOUND GUIDED LEFT BREAST CORE NEEDLE BIOPSY

[Series 1: us breast bx w loc dev 1st lesion img bx spec us g · 0.06mm/px · 7 of 7 slices shown]
[im 1/7]
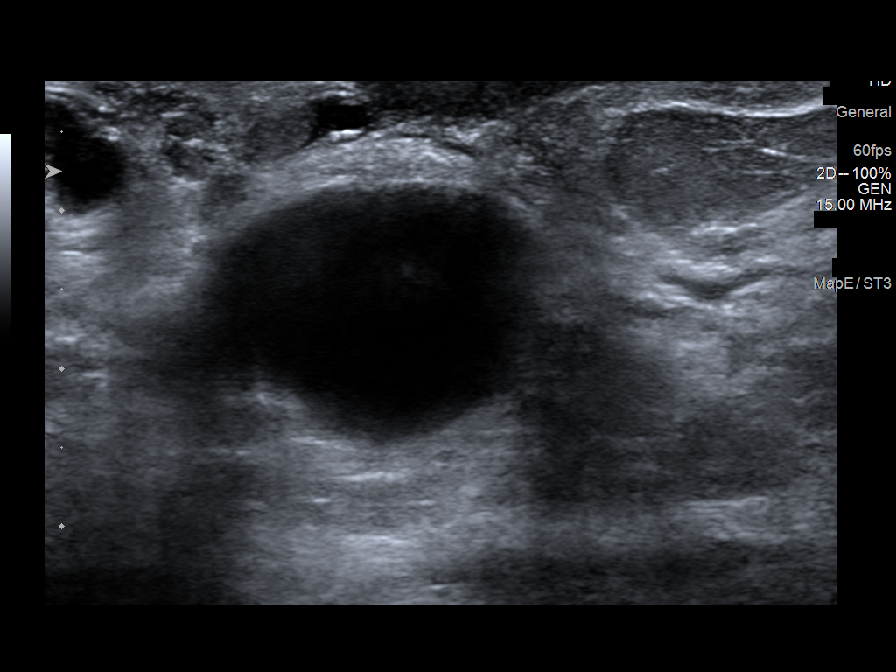
[im 2/7]
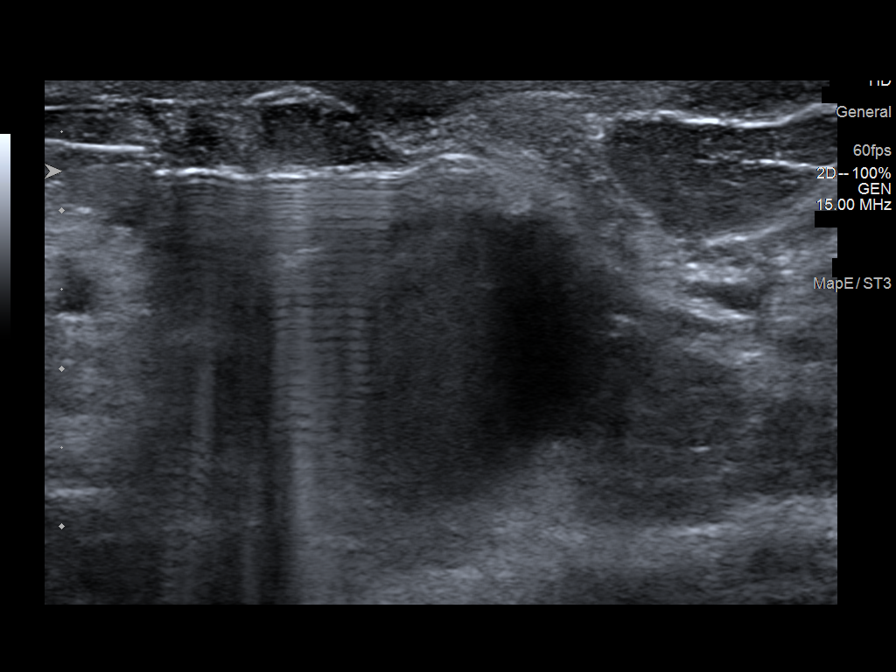
[im 3/7]
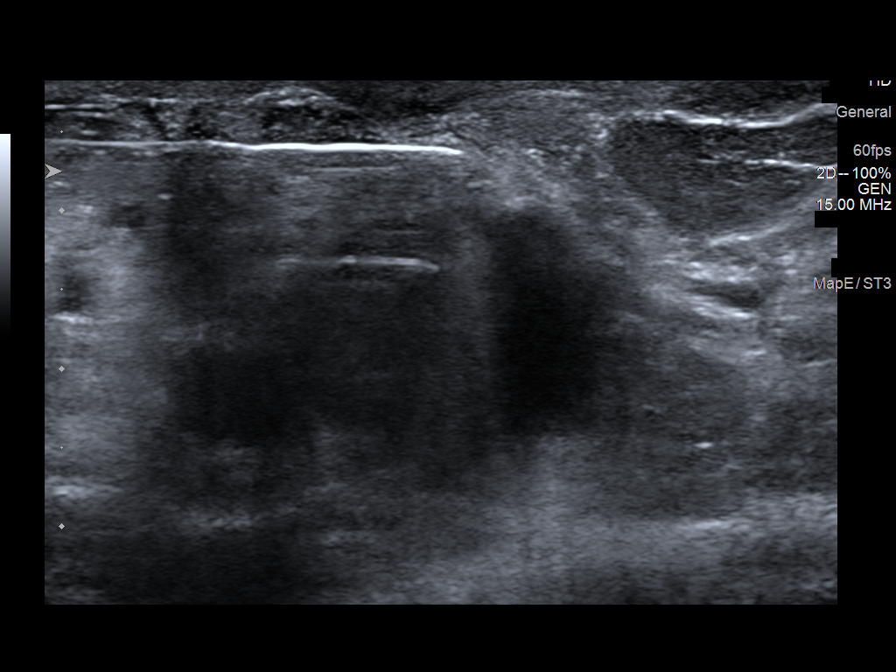
[im 4/7]
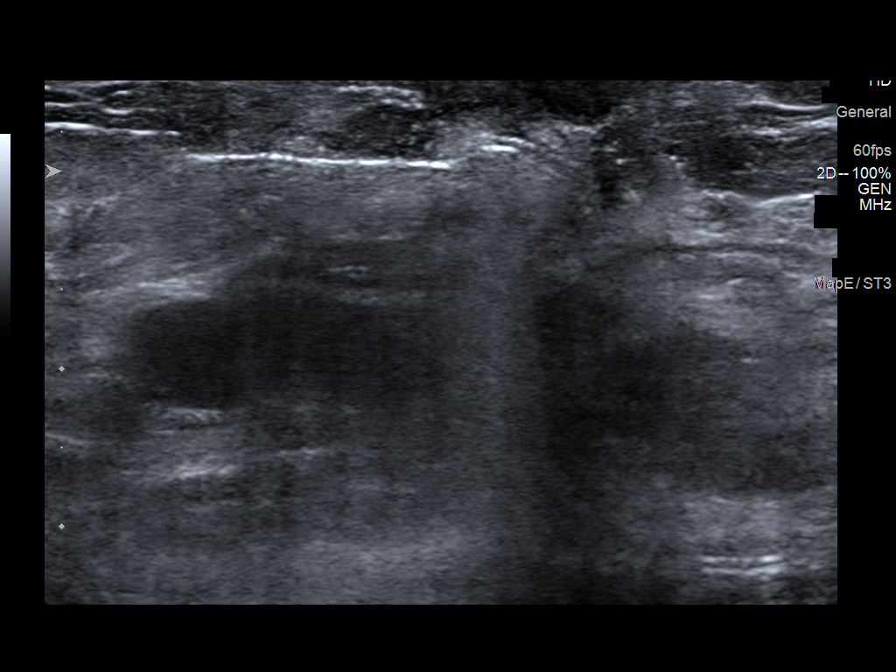
[im 5/7]
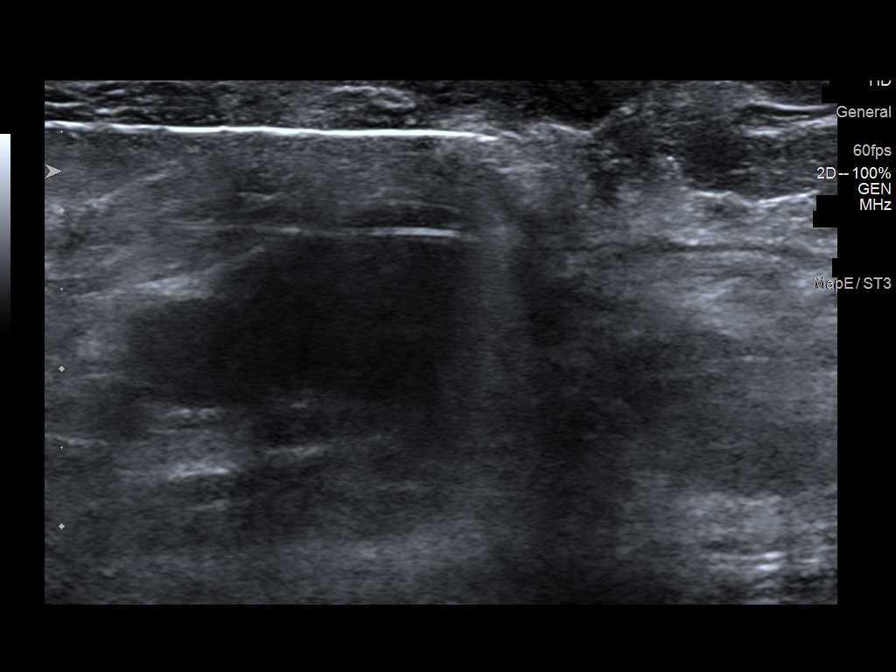
[im 6/7]
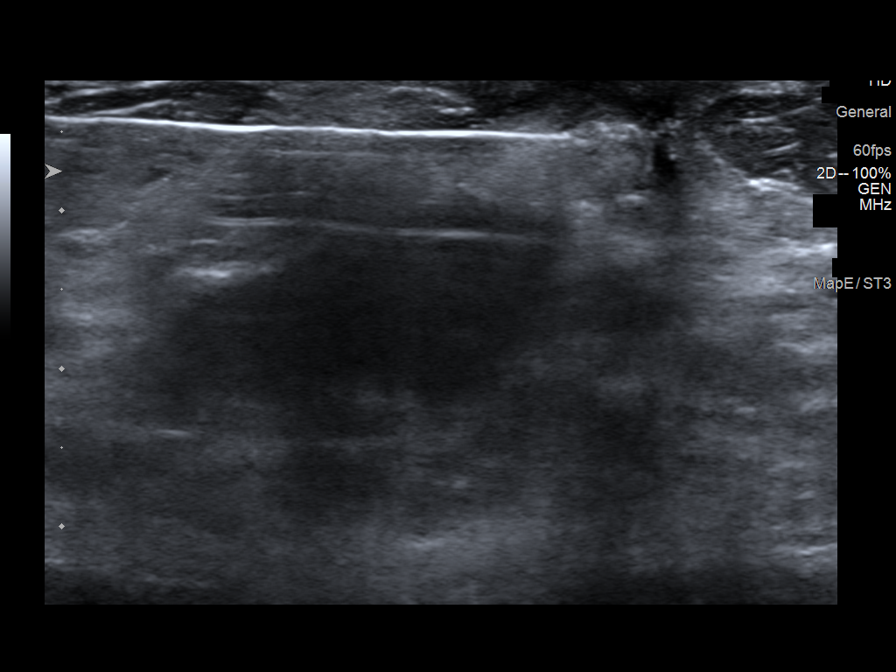
[im 7/7]
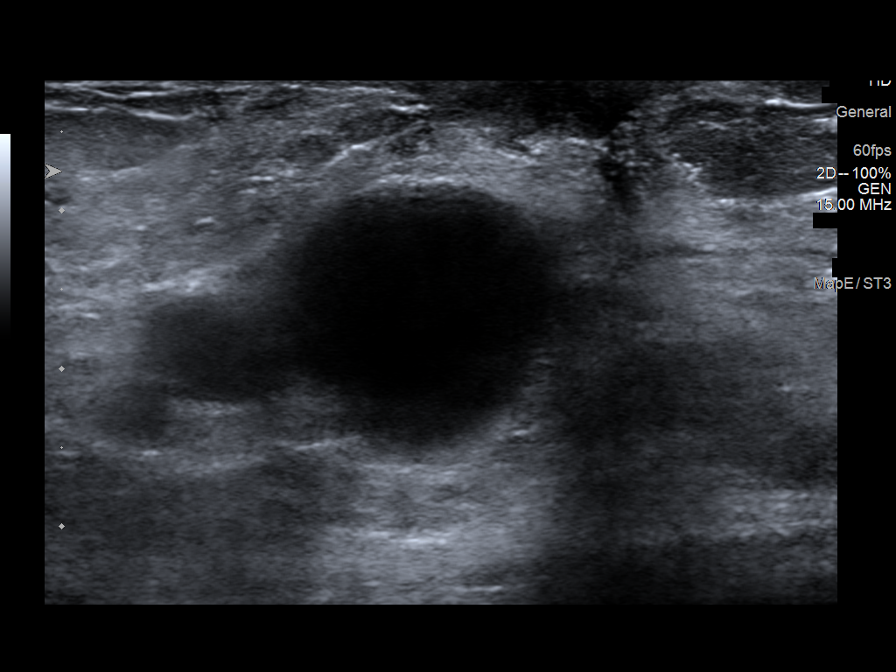

[7 of 7 positions shown; findings below may reference images not displayed]



Lesion quadrant: Lower outer quadrant

Using sterile technique and 1% Lidocaine as local anesthetic, under
direct ultrasound visualization, a 14 gauge Theophlius device was
used to perform biopsy of left breast mass 5 o'clock position using
a lateral approach. At the conclusion of the procedure ribbon shaped
tissue marker clip was deployed into the biopsy cavity. Follow up 2
view mammogram was performed and dictated separately.
IMPRESSION: Ultrasound guided biopsy of left breast mass 5 o'clock position. No
apparent complications.

ADDENDUM:
Pathology revealed ESSENTIALLY UNREMARKABLE BREAST PARENCHYMA of the
Left breast, 5 o'clock. This was found to be concordant by Dr. Minadaki
Kennard.

Pathology results were discussed with the patient by telephone. The
patient reported doing well after the biopsy with tenderness at the
site. Post biopsy instructions and care were reviewed and questions
were answered. The patient was encouraged to call The [REDACTED] for any additional concerns. My direct phone
number was provided for the patient.

Surgical consultation has been arranged with Dr. Valdemar Polito at
[REDACTED] on March 23, 2020, given history of nipple
discharge.

Recommendation for bilateral breast MRI for further evaluation of
clear Left breast spontaneous nipple discharge.

Pathology results reported by Rudi Jumper, RN on 03/05/2020.



Lesion quadrant: Lower outer quadrant

Using sterile technique and 1% Lidocaine as local anesthetic, under
direct ultrasound visualization, a 14 gauge Theophlius device was
used to perform biopsy of left breast mass 5 o'clock position using
a lateral approach. At the conclusion of the procedure ribbon shaped
tissue marker clip was deployed into the biopsy cavity. Follow up 2
view mammogram was performed and dictated separately.
IMPRESSION: Ultrasound guided biopsy of left breast mass 5 o'clock position. No
apparent complications.

## 2022-02-06 ENCOUNTER — Encounter (HOSPITAL_COMMUNITY): Payer: Self-pay

## 2022-02-14 ENCOUNTER — Other Ambulatory Visit: Payer: Self-pay | Admitting: Hematology and Oncology

## 2022-02-20 ENCOUNTER — Telehealth: Payer: Self-pay | Admitting: Hematology and Oncology

## 2022-02-20 NOTE — Telephone Encounter (Signed)
.  Called patient to schedule appointment per 5/24 inbasket, patient is aware of date and time.   

## 2022-04-02 NOTE — Progress Notes (Signed)
Patient Care Team: Celene Squibb, MD as PCP - General (Internal Medicine)  DIAGNOSIS:  Encounter Diagnosis  Name Primary?   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 female is here because of recent diagnosis of LCIS of the right breast. She states that the hot flashes are terrible and she has gained weight since she has been on tamoxifen.  She denies any lumps or nodules in the breast.   ALLERGIES:  is allergic to codeine.  MEDICATIONS:  Current Outpatient Medications  Medication Sig Dispense Refill   atorvastatin (LIPITOR) 40 MG tablet Take 40 mg by mouth daily.     Iron-FA-B Cmp-C-Biot-Probiotic (FUSION PLUS) CAPS Take 1 capsule by mouth daily.     tamoxifen (NOLVADEX) 20 MG tablet Take 0.5 tablets (10 mg total) by mouth daily. 90 tablet 0   No current facility-administered medications for this visit.    PHYSICAL EXAMINATION: ECOG PERFORMANCE STATUS: 1 - Symptomatic but completely ambulatory  Vitals:   04/15/22 1350  BP: (!) 168/84  Pulse: (!) 106  Resp: 18  Temp: 98.8 F (37.1 C)  SpO2: 100%   Filed Weights   04/15/22 1356  Weight: 182 lb 14.4 oz (83 kg)    BREAST: No palpable masses or nodules in either right or left breasts. No palpable axillary supraclavicular or infraclavicular adenopathy no breast tenderness or nipple discharge. (exam performed in the presence of a chaperone)  LABORATORY DATA:  I have reviewed the data as listed     No data to display          Lab Results  Component Value Date   WBC 5.7 11/28/2020   HGB 11.3 (L) 11/28/2020   HCT 35.2 (L) 11/28/2020   MCV 84.6 11/28/2020   PLT 351 11/28/2020    ASSESSMENT & PLAN:  Lobular carcinoma in situ (LCIS) of right breast Palpable left breast mass, mammogram on 02/24/20 showed a 1.6cm cyst corresponding to the palpable mass, and a 1.4cm mass at the 5 o'clock position  possibly representing a papilloma.  Biopsy on 03/02/20 showed no evidence of malignancy.  Right breast biopsy on 08/02/20 showed ductal papilloma and fibroadenoma. Left breast biopsy on 09/03/20 showed intraductal papilloma and no evidence of malignancy.    Bilateral lumpectomies on 12/05/20 with Dr. 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  Current treatment: Tamoxifen 20 mg daily started 12/25/2020 Tamoxifen toxicities: Hot flashes I recommend reducing the dosage of tamoxifen to 10 mg daily.  Breast cancer surveillance: 1.  Breast exam 04/15/2022: Benign 2. mammogram 07/30/2021 benign  Return to clinic in 1 year for follow-up    Orders Placed This Encounter  Procedures   MR BREAST BILATERAL [facility]    Standing Status:   Future    Standing Expiration Date:   04/16/2023    Order Specific Question:   If indicated for the ordered procedure, I authorize the administration of contrast media per Radiology protocol    Answer:   Yes    Order Specific Question:   What is the patient's sedation requirement?    Answer:   No Sedation    Order Specific Question:   Does the patient have a pacemaker or implanted devices?    Answer:   No  Order Specific Question:   Preferred imaging location?    Answer:   [facility] (table limit - 550 lbs)    Order Specific Question:   Release to patient    Answer:   Immediate   The patient has a good understanding of the overall plan. she agrees with it. she will call with any problems that may develop before the next visit here. Total time spent: 30 mins including face to face time and time spent for planning, charting and co-ordination of care   Harriette Ohara, MD 04/15/22    I Gardiner Coins am scribing for Dr. Lindi Adie  I have reviewed the above documentation for accuracy and completeness, and I agree with the  above.

## 2022-04-15 ENCOUNTER — Inpatient Hospital Stay: Payer: BC Managed Care – PPO | Attending: Hematology and Oncology | Admitting: Hematology and Oncology

## 2022-04-15 ENCOUNTER — Other Ambulatory Visit: Payer: Self-pay

## 2022-04-15 VITALS — BP 168/84 | HR 106 | Temp 98.8°F | Resp 18 | Ht 63.0 in | Wt 182.9 lb

## 2022-04-15 DIAGNOSIS — Z79810 Long term (current) use of selective estrogen receptor modulators (SERMs): Secondary | ICD-10-CM | POA: Diagnosis not present

## 2022-04-15 DIAGNOSIS — D0501 Lobular carcinoma in situ of right breast: Secondary | ICD-10-CM | POA: Diagnosis not present

## 2022-04-15 DIAGNOSIS — N951 Menopausal and female climacteric states: Secondary | ICD-10-CM | POA: Diagnosis not present

## 2022-04-15 MED ORDER — TAMOXIFEN CITRATE 20 MG PO TABS: 10.0000 mg | ORAL_TABLET | Freq: Every day | ORAL | 0 refills | Status: DC

## 2022-04-15 NOTE — Assessment & Plan Note (Addendum)
Palpable left breast mass, mammogram on 02/24/20 showed a 1.6cm cyst corresponding to the palpable mass, and a 1.4cm mass at the 5 o'clock position possibly representing a papilloma.  Biopsy on 03/02/20 showed no evidence of malignancy.  Right breast biopsy on 08/02/20 showed ductal papilloma and fibroadenoma. Left breast biopsy on 09/03/20 showed intraductal papilloma and no evidence of malignancy.   Bilateral lumpectomies on 12/05/20 with Dr. 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  Current treatment: Tamoxifen 20 mg daily started 12/25/2020 Tamoxifen toxicities: Hot flashes I recommend reducing the dosage of tamoxifen to 10 mg daily.  Breast cancer surveillance: 1.  Breast exam 04/15/2022: Benign 2. mammogram 07/30/2021 benign  Return to clinic in 1 year for follow-up

## 2022-04-16 ENCOUNTER — Telehealth: Payer: Self-pay | Admitting: Hematology and Oncology

## 2022-04-16 NOTE — Telephone Encounter (Signed)
Scheduled appointment per 7/18 los. Patient is aware.

## 2022-04-28 DIAGNOSIS — E782 Mixed hyperlipidemia: Secondary | ICD-10-CM | POA: Diagnosis not present

## 2022-04-28 DIAGNOSIS — D509 Iron deficiency anemia, unspecified: Secondary | ICD-10-CM | POA: Diagnosis not present

## 2022-05-01 DIAGNOSIS — Z0001 Encounter for general adult medical examination with abnormal findings: Secondary | ICD-10-CM | POA: Diagnosis not present

## 2022-05-19 ENCOUNTER — Other Ambulatory Visit: Payer: Self-pay | Admitting: Hematology and Oncology

## 2022-06-27 ENCOUNTER — Other Ambulatory Visit: Payer: Self-pay | Admitting: Hematology and Oncology

## 2022-06-27 DIAGNOSIS — Z1231 Encounter for screening mammogram for malignant neoplasm of breast: Secondary | ICD-10-CM

## 2022-07-07 IMAGING — MR MR BREAST BX W/ LOC DEV 1ST LEASION IMAGE BX SPEC MR GUIDE*R*
6 of 8 series · 32 of 48 positions shown · IV contrast (8 ml gadavist)
Comparison: Previous exams.
COMPARISON: Previous exams.

Addendum:
CLINICAL DATA: 49-year-old female with an indeterminate enhancing
mass in the lower outer right breast.

EXAM:
MRI GUIDED CORE NEEDLE BIOPSY OF THE RIGHT BREAST
TECHNIQUE: Multiplanar, multisequence MR imaging of the right breast was
performed both before and after administration of intravenous
contrast.
CONTRAST:  8mL GADAVIST GADOBUTROL 1 MMOL/ML IV SOLN

[Series 2: fiducial unilateral · sagittal · 2.0mm · 1.33mm/px · 1 of 52 slices shown]
[im 1/52]
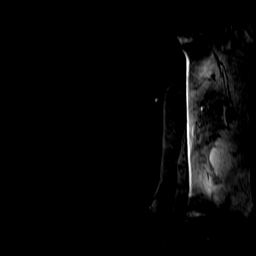

[Series 3: dynamic pre · axial · non-contrast · 1.3mm · 0.73mm/px · z∈[-69,+117]mm · 6 of 144 slices shown]
[im 1/144]
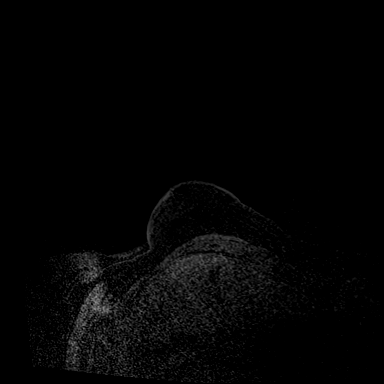
[im 29/144]
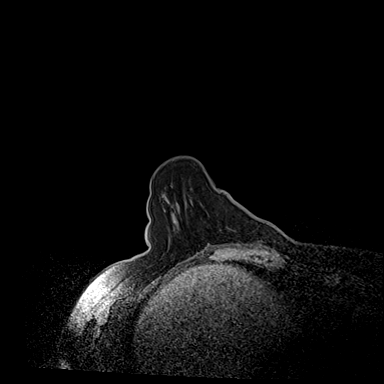
[im 58/144]
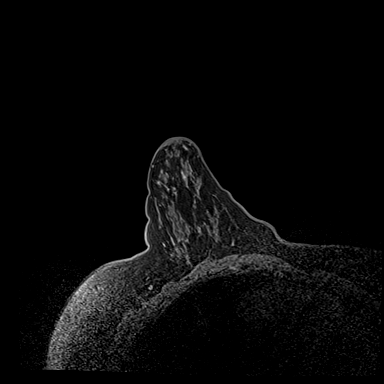
[im 86/144]
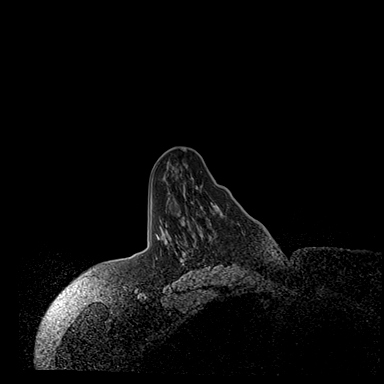
[im 115/144]
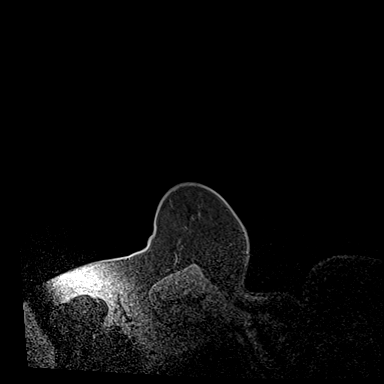
[im 144/144]
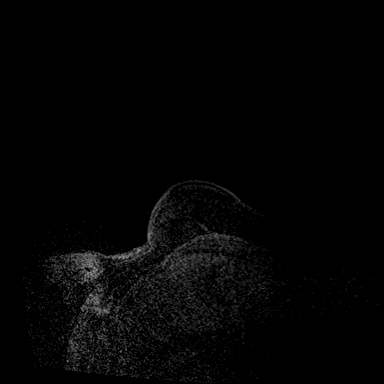

[Series 4: dynamic post 20 · axial · 1.3mm · 0.73mm/px · z∈[-69,+117]mm · 6 of 144 slices shown (1 of 2)]
[im 1/144]
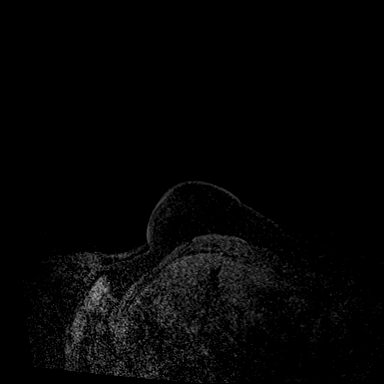
[im 29/144]
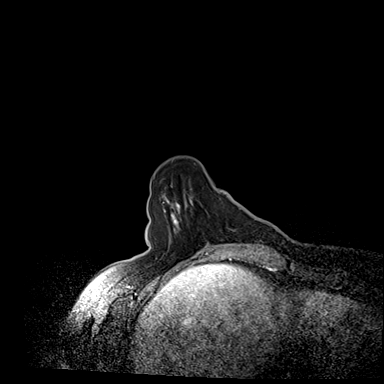
[im 58/144]
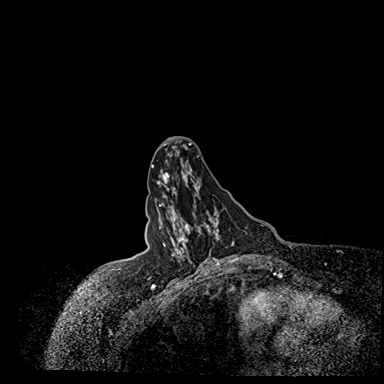
[im 86/144]
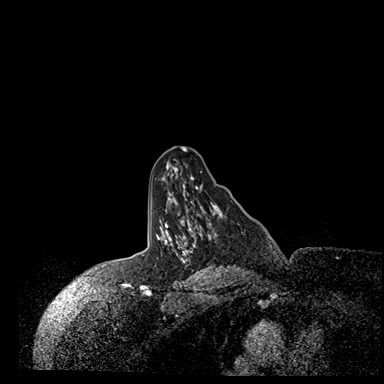
[im 115/144]
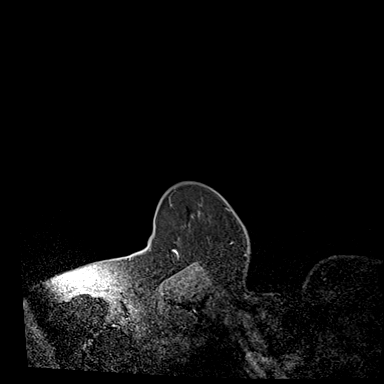
[im 144/144]
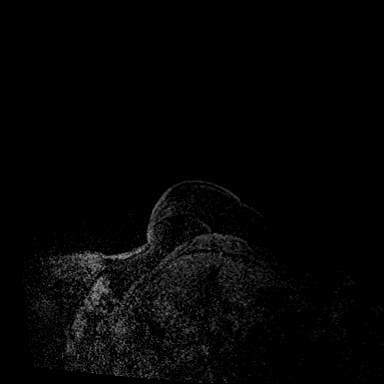

[Series 5: dynamic post 20 · axial · 1.3mm · 0.73mm/px · z∈[-69,+117]mm · 7 of 144 slices shown (2 of 2)]
[im 1/144]
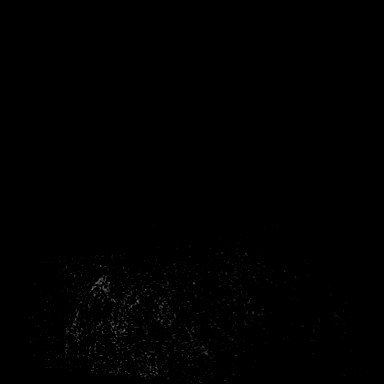
[im 24/144]
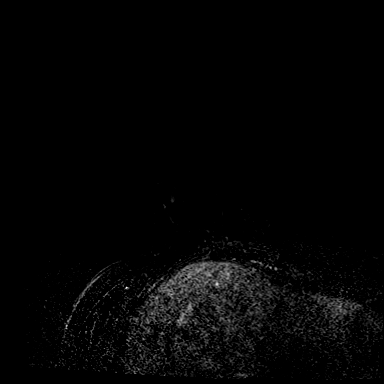
[im 48/144]
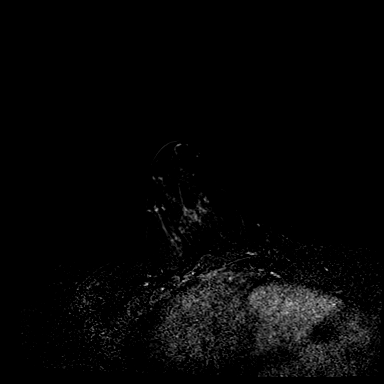
[im 72/144]
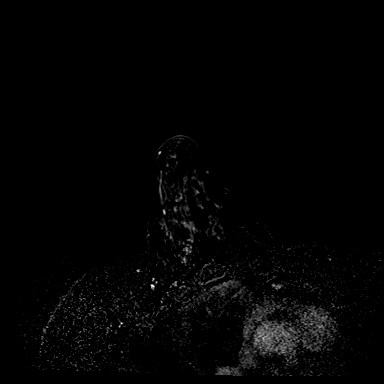
[im 96/144]
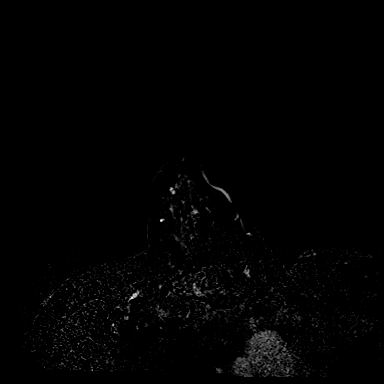
[im 120/144]
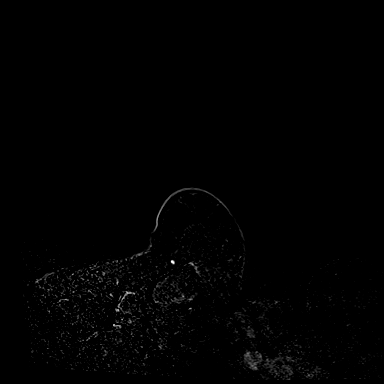
[im 144/144]
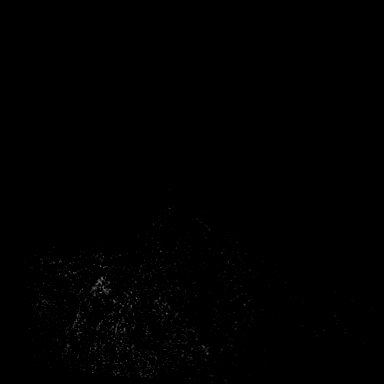

[Series 6: dynamic post 3 · axial · 1.3mm · 0.73mm/px · z∈[-69,+117]mm · 7 of 144 slices shown (1 of 2)]
[im 1/144]
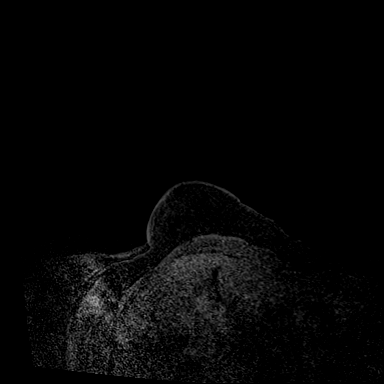
[im 24/144]
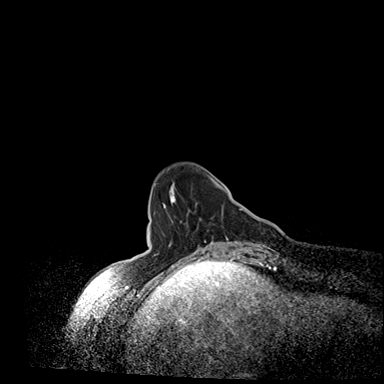
[im 48/144]
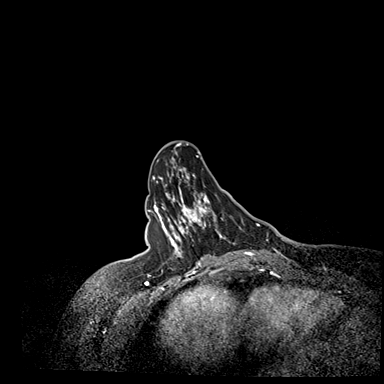
[im 72/144]
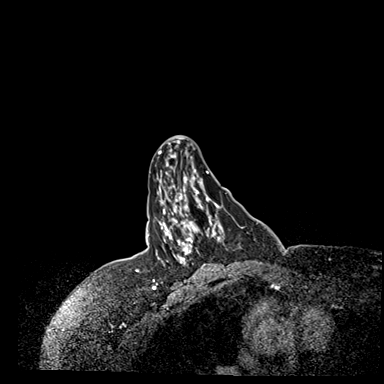
[im 96/144]
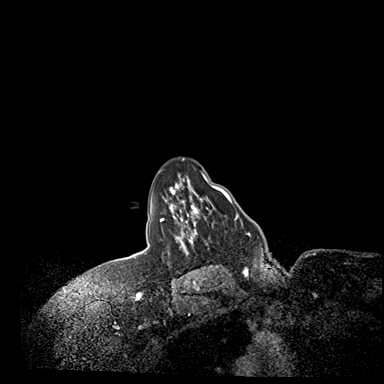
[im 120/144]
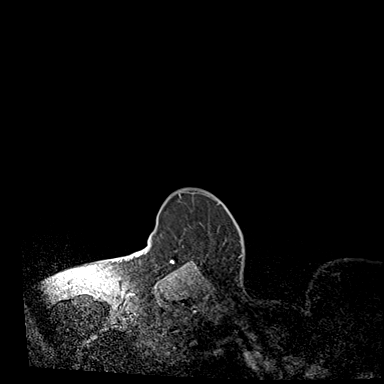
[im 144/144]
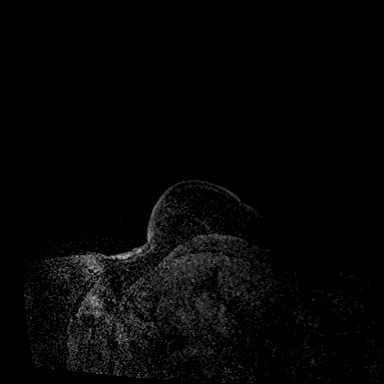

[Series 7: dynamic post 3 · axial · 1.3mm · 0.73mm/px · z∈[-69,+54]mm · 5 of 144 slices shown (2 of 2)]
[im 1/144]
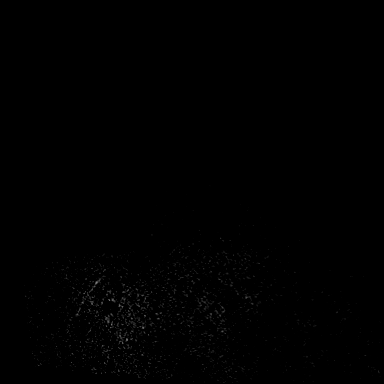
[im 24/144]
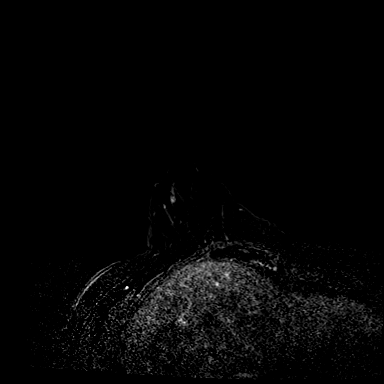
[im 48/144]
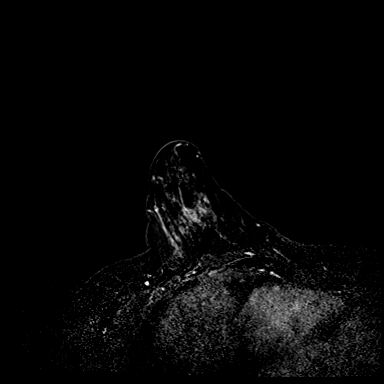
[im 72/144]
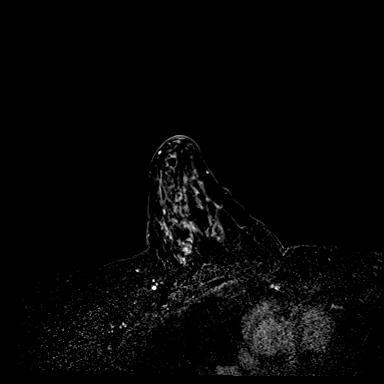
[im 96/144]
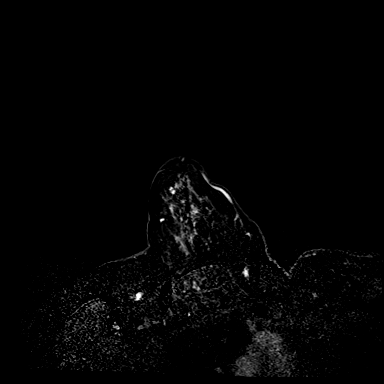

[32 of 48 positions shown; findings below may reference images not displayed]

FINDINGS: I met with the patient, and we discussed the procedure of MRI guided
biopsy, including risks, benefits, and alternatives. Specifically,
we discussed the risks of infection, bleeding, tissue injury, clip
migration, and inadequate sampling. Informed, written consent was
given. The usual time out protocol was performed immediately prior
to the procedure.

Using sterile technique, 1% Lidocaine, MRI guidance, and a 9 gauge
vacuum assisted device, biopsy was performed of the enhancing mass
in the lower outer right breast was performed using a lateral to
medial approach. At the conclusion of the procedure, a cylindrical
shaped tissue marker clip was deployed into the biopsy cavity.
Follow-up 2-view mammogram was performed and dictated separately.
IMPRESSION: MRI guided biopsy of the enhancing mass in the lower outer right
breast. No apparent complications.

ADDENDUM:
Pathology revealed DUCTAL PAPILLOMA, FIBROADENOMA, FIBROCYSTIC
CHANGES WITH USUAL DUCTAL HYPERPLASIA of the RIGHT breast, lower
outer. This was found to be concordant by Dr. Zeinab Tiger, with
excision recommended.

Pathology results were discussed with the patient by telephone. The
patient reported doing well after the biopsy with tenderness at the
site. Post biopsy instructions and care were reviewed and questions
were answered. The patient was encouraged to call The [REDACTED]

Surgical consultation has been arranged with Dr. Muhammed A Ozo at
[REDACTED] on August 06, 2020.

If surgical excision is pursued, the patient will need to return for
MRI guided RIGHT breast clip placement, biopsy marking clip is not
visualized on the post biopsy mammogram.

NOTE: MRI guided biopsy of the peripherally enhancing cyst in the
LEFT breast is recommended prior to surgery.

Pathology results reported by Otokar Roelandt, RN on 08/03/2020.

*** End of Addendum ***
FINDINGS: I met with the patient, and we discussed the procedure of MRI guided
biopsy, including risks, benefits, and alternatives. Specifically,
we discussed the risks of infection, bleeding, tissue injury, clip
migration, and inadequate sampling. Informed, written consent was
given. The usual time out protocol was performed immediately prior
to the procedure.

Using sterile technique, 1% Lidocaine, MRI guidance, and a 9 gauge
vacuum assisted device, biopsy was performed of the enhancing mass
in the lower outer right breast was performed using a lateral to
medial approach. At the conclusion of the procedure, a cylindrical
shaped tissue marker clip was deployed into the biopsy cavity.
Follow-up 2-view mammogram was performed and dictated separately.
IMPRESSION: MRI guided biopsy of the enhancing mass in the lower outer right
breast. No apparent complications.

## 2022-07-31 ENCOUNTER — Ambulatory Visit
Admission: RE | Admit: 2022-07-31 | Discharge: 2022-07-31 | Disposition: A | Discharge status: Home / Self Care | Payer: BC Managed Care – PPO | Source: Ambulatory Visit | Attending: Hematology and Oncology | Admitting: Hematology and Oncology

## 2022-07-31 DIAGNOSIS — Z1231 Encounter for screening mammogram for malignant neoplasm of breast: Secondary | ICD-10-CM

## 2022-08-12 ENCOUNTER — Other Ambulatory Visit: Payer: Self-pay | Admitting: Hematology and Oncology

## 2022-10-06 DIAGNOSIS — E782 Mixed hyperlipidemia: Secondary | ICD-10-CM | POA: Diagnosis not present

## 2022-10-06 DIAGNOSIS — D509 Iron deficiency anemia, unspecified: Secondary | ICD-10-CM | POA: Diagnosis not present

## 2022-10-06 DIAGNOSIS — N951 Menopausal and female climacteric states: Secondary | ICD-10-CM | POA: Diagnosis not present

## 2022-10-14 DIAGNOSIS — D509 Iron deficiency anemia, unspecified: Secondary | ICD-10-CM | POA: Diagnosis not present

## 2022-10-14 DIAGNOSIS — E782 Mixed hyperlipidemia: Secondary | ICD-10-CM | POA: Diagnosis not present

## 2022-10-14 DIAGNOSIS — I10 Essential (primary) hypertension: Secondary | ICD-10-CM | POA: Diagnosis not present

## 2022-10-14 DIAGNOSIS — N951 Menopausal and female climacteric states: Secondary | ICD-10-CM | POA: Diagnosis not present

## 2023-01-22 DIAGNOSIS — L820 Inflamed seborrheic keratosis: Secondary | ICD-10-CM | POA: Diagnosis not present

## 2023-01-22 DIAGNOSIS — L570 Actinic keratosis: Secondary | ICD-10-CM | POA: Diagnosis not present

## 2023-01-22 DIAGNOSIS — B0089 Other herpesviral infection: Secondary | ICD-10-CM | POA: Diagnosis not present

## 2023-01-22 DIAGNOSIS — X32XXXD Exposure to sunlight, subsequent encounter: Secondary | ICD-10-CM | POA: Diagnosis not present

## 2023-03-10 DIAGNOSIS — L820 Inflamed seborrheic keratosis: Secondary | ICD-10-CM | POA: Diagnosis not present

## 2023-03-10 DIAGNOSIS — X32XXXD Exposure to sunlight, subsequent encounter: Secondary | ICD-10-CM | POA: Diagnosis not present

## 2023-03-10 DIAGNOSIS — L570 Actinic keratosis: Secondary | ICD-10-CM | POA: Diagnosis not present

## 2023-03-17 ENCOUNTER — Ambulatory Visit (HOSPITAL_COMMUNITY)
Admission: RE | Admit: 2023-03-17 | Discharge: 2023-03-17 | Disposition: A | Discharge status: Home / Self Care | Payer: BC Managed Care – PPO | Source: Ambulatory Visit | Attending: Hematology and Oncology | Admitting: Hematology and Oncology

## 2023-03-17 DIAGNOSIS — C50911 Malignant neoplasm of unspecified site of right female breast: Secondary | ICD-10-CM | POA: Diagnosis not present

## 2023-03-17 DIAGNOSIS — Z1239 Encounter for other screening for malignant neoplasm of breast: Secondary | ICD-10-CM | POA: Diagnosis not present

## 2023-03-17 DIAGNOSIS — D0501 Lobular carcinoma in situ of right breast: Secondary | ICD-10-CM | POA: Insufficient documentation

## 2023-03-17 MED ORDER — GADOBUTROL 1 MMOL/ML IV SOLN
7.5000 mL | Freq: Once | INTRAVENOUS | Status: AC | PRN
  Administered 2023-03-17: 7.5 mL via INTRAVENOUS

## 2023-04-08 DIAGNOSIS — N951 Menopausal and female climacteric states: Secondary | ICD-10-CM | POA: Diagnosis not present

## 2023-04-08 DIAGNOSIS — E782 Mixed hyperlipidemia: Secondary | ICD-10-CM | POA: Diagnosis not present

## 2023-04-08 DIAGNOSIS — D509 Iron deficiency anemia, unspecified: Secondary | ICD-10-CM | POA: Diagnosis not present

## 2023-04-14 ENCOUNTER — Other Ambulatory Visit (HOSPITAL_COMMUNITY): Payer: Self-pay | Admitting: Internal Medicine

## 2023-04-14 DIAGNOSIS — E782 Mixed hyperlipidemia: Secondary | ICD-10-CM

## 2023-04-14 DIAGNOSIS — N951 Menopausal and female climacteric states: Secondary | ICD-10-CM | POA: Diagnosis not present

## 2023-04-14 DIAGNOSIS — D509 Iron deficiency anemia, unspecified: Secondary | ICD-10-CM | POA: Diagnosis not present

## 2023-04-14 DIAGNOSIS — I10 Essential (primary) hypertension: Secondary | ICD-10-CM | POA: Diagnosis not present

## 2023-04-19 NOTE — Progress Notes (Signed)
   Patient Care Team: Benita Stabile, MD as PCP - General (Internal Medicine)  DIAGNOSIS: No diagnosis found.  SUMMARY OF ONCOLOGIC HISTORY: Oncology History   No history exists.    CHIEF COMPLIANT:  Follow-up LCIS on Tamoxifen   INTERVAL HISTORY: Molly Roth is a 52 y.o. female is here because of recent diagnosis of LCIS of the right breast. She presents to the clinic for a follow-up.    ALLERGIES:  is allergic to codeine.  MEDICATIONS:  Current Outpatient Medications  Medication Sig Dispense Refill   atorvastatin (LIPITOR) 40 MG tablet Take 40 mg by mouth daily.     Iron-FA-B Cmp-C-Biot-Probiotic (FUSION PLUS) CAPS Take 1 capsule by mouth daily.     tamoxifen (NOLVADEX) 20 MG tablet TAKE 1 TABLET BY MOUTH EVERY DAY 90 tablet 0   No current facility-administered medications for this visit.    PHYSICAL EXAMINATION: ECOG PERFORMANCE STATUS: {CHL ONC ECOG [phone]}  There were no vitals filed for this visit. There were no vitals filed for this visit.  BREAST:*** No palpable masses or nodules in either right or left breasts. No palpable axillary supraclavicular or infraclavicular adenopathy no breast tenderness or nipple discharge. (exam performed in the presence of a chaperone)  LABORATORY DATA:  I have reviewed the data as listed     No data to display          Lab Results  Component Value Date   WBC 5.7 11/28/2020   HGB 11.3 (L) 11/28/2020   HCT 35.2 (L) 11/28/2020   MCV 84.6 11/28/2020   PLT 351 11/28/2020    ASSESSMENT & PLAN:  No problem-specific Assessment & Plan notes found for this encounter.    No orders of the defined types were placed in this encounter.  The patient has a good understanding of the overall plan. she agrees with it. she will call with any problems that may develop before the next visit here. Total time spent: 30 mins including face to face time and time spent for planning, charting and co-ordination of care    Sherlyn Lick, CMA 04/19/23    I Janan Ridge am acting as a Neurosurgeon for [employer]  ***

## 2023-04-20 ENCOUNTER — Other Ambulatory Visit: Payer: Self-pay

## 2023-04-20 ENCOUNTER — Inpatient Hospital Stay: Payer: BC Managed Care – PPO | Attending: Hematology and Oncology | Admitting: Hematology and Oncology

## 2023-04-20 VITALS — BP 125/77 | HR 95 | Temp 97.9°F | Resp 18 | Ht 63.0 in | Wt 185.4 lb

## 2023-04-20 DIAGNOSIS — D0501 Lobular carcinoma in situ of right breast: Secondary | ICD-10-CM | POA: Diagnosis not present

## 2023-04-20 DIAGNOSIS — Z79810 Long term (current) use of selective estrogen receptor modulators (SERMs): Secondary | ICD-10-CM | POA: Insufficient documentation

## 2023-04-20 DIAGNOSIS — D0502 Lobular carcinoma in situ of left breast: Secondary | ICD-10-CM | POA: Diagnosis not present

## 2023-04-20 MED ORDER — TAMOXIFEN CITRATE 10 MG PO TABS: 5.0000 mg | ORAL_TABLET | Freq: Every day | ORAL | 1 refills | Status: DC

## 2023-04-20 NOTE — Assessment & Plan Note (Addendum)
Palpable left breast mass, mammogram on 02/24/20 showed a 1.6cm cyst corresponding to the palpable mass, and a 1.4cm mass at the 5 o'clock position possibly representing a papilloma.  Biopsy on 03/02/20 showed no evidence of malignancy.   Right breast biopsy on 08/02/20 showed ductal papilloma and fibroadenoma. Left breast biopsy on 09/03/20 showed intraductal papilloma and no evidence of malignancy.    Bilateral lumpectomies on 12/05/20 with Dr. Corliss Skains for which pathology showed in the left breast, intraductal papilloma with usual ductal hyperplasia, 1.2cm, and in the right breast, lobular carcinoma in situ.   Current treatment: Tamoxifen 20 mg daily started 12/25/2020 dose reduced to 10 mg daily on 04/15/2022 dose reduced to 5 mg a day on 04/20/2023.  Tamoxifen toxicities:  Hot flashes Joint stiffness and achiness  Recommended reducing the dose of tamoxifen to 5 mg a day   Breast cancer surveillance: 1.  Breast exam 04/20/2023: Benign 2. mammogram 08/14/2022 benign 3.  Breast MRI 03/17/2023: Benign breast density category C   Return to clinic in 1 year for follow-up

## 2023-04-21 ENCOUNTER — Telehealth: Payer: Self-pay | Admitting: Hematology and Oncology

## 2023-04-21 NOTE — Telephone Encounter (Signed)
Scheduled appointment per 7/22 los. Left voicemail.

## 2023-05-08 ENCOUNTER — Ambulatory Visit (HOSPITAL_COMMUNITY)
Admission: RE | Admit: 2023-05-08 | Discharge: 2023-05-08 | Disposition: A | Discharge status: Home / Self Care | Payer: Self-pay | Source: Ambulatory Visit | Attending: Internal Medicine | Admitting: Internal Medicine

## 2023-05-08 DIAGNOSIS — E782 Mixed hyperlipidemia: Secondary | ICD-10-CM | POA: Insufficient documentation

## 2023-05-08 DIAGNOSIS — Z136 Encounter for screening for cardiovascular disorders: Secondary | ICD-10-CM | POA: Diagnosis not present

## 2023-07-14 DIAGNOSIS — M79671 Pain in right foot: Secondary | ICD-10-CM | POA: Diagnosis not present

## 2023-07-14 DIAGNOSIS — M79672 Pain in left foot: Secondary | ICD-10-CM | POA: Diagnosis not present

## 2023-07-14 DIAGNOSIS — M722 Plantar fascial fibromatosis: Secondary | ICD-10-CM | POA: Diagnosis not present

## 2023-07-27 ENCOUNTER — Other Ambulatory Visit: Payer: Self-pay | Admitting: Hematology and Oncology

## 2023-07-27 DIAGNOSIS — Z1231 Encounter for screening mammogram for malignant neoplasm of breast: Secondary | ICD-10-CM

## 2023-08-11 DIAGNOSIS — M79671 Pain in right foot: Secondary | ICD-10-CM | POA: Diagnosis not present

## 2023-08-11 DIAGNOSIS — M722 Plantar fascial fibromatosis: Secondary | ICD-10-CM | POA: Diagnosis not present

## 2023-08-19 DIAGNOSIS — R252 Cramp and spasm: Secondary | ICD-10-CM | POA: Diagnosis not present

## 2023-08-19 DIAGNOSIS — Z90710 Acquired absence of both cervix and uterus: Secondary | ICD-10-CM | POA: Diagnosis not present

## 2023-08-19 DIAGNOSIS — R350 Frequency of micturition: Secondary | ICD-10-CM | POA: Diagnosis not present

## 2023-08-19 DIAGNOSIS — R319 Hematuria, unspecified: Secondary | ICD-10-CM | POA: Diagnosis not present

## 2023-08-20 ENCOUNTER — Ambulatory Visit
Admission: RE | Admit: 2023-08-20 | Discharge: 2023-08-20 | Disposition: A | Discharge status: Home / Self Care | Payer: BC Managed Care – PPO | Source: Ambulatory Visit | Attending: Hematology and Oncology | Admitting: Hematology and Oncology

## 2023-08-20 DIAGNOSIS — Z1231 Encounter for screening mammogram for malignant neoplasm of breast: Secondary | ICD-10-CM | POA: Diagnosis not present

## 2023-08-23 DIAGNOSIS — J019 Acute sinusitis, unspecified: Secondary | ICD-10-CM | POA: Diagnosis not present

## 2023-08-23 DIAGNOSIS — Z6831 Body mass index (BMI) 31.0-31.9, adult: Secondary | ICD-10-CM | POA: Diagnosis not present

## 2023-08-23 DIAGNOSIS — R030 Elevated blood-pressure reading, without diagnosis of hypertension: Secondary | ICD-10-CM | POA: Diagnosis not present

## 2023-08-23 DIAGNOSIS — E669 Obesity, unspecified: Secondary | ICD-10-CM | POA: Diagnosis not present

## 2023-10-20 DIAGNOSIS — N951 Menopausal and female climacteric states: Secondary | ICD-10-CM | POA: Diagnosis not present

## 2023-10-20 DIAGNOSIS — E669 Obesity, unspecified: Secondary | ICD-10-CM | POA: Diagnosis not present

## 2023-10-20 DIAGNOSIS — E782 Mixed hyperlipidemia: Secondary | ICD-10-CM | POA: Diagnosis not present

## 2023-10-27 DIAGNOSIS — Z0001 Encounter for general adult medical examination with abnormal findings: Secondary | ICD-10-CM | POA: Diagnosis not present

## 2023-10-27 DIAGNOSIS — I10 Essential (primary) hypertension: Secondary | ICD-10-CM | POA: Diagnosis not present

## 2023-10-27 DIAGNOSIS — N951 Menopausal and female climacteric states: Secondary | ICD-10-CM | POA: Diagnosis not present

## 2023-10-27 DIAGNOSIS — D509 Iron deficiency anemia, unspecified: Secondary | ICD-10-CM | POA: Diagnosis not present

## 2023-10-27 DIAGNOSIS — E782 Mixed hyperlipidemia: Secondary | ICD-10-CM | POA: Diagnosis not present

## 2024-01-28 DIAGNOSIS — X32XXXD Exposure to sunlight, subsequent encounter: Secondary | ICD-10-CM | POA: Diagnosis not present

## 2024-01-28 DIAGNOSIS — L570 Actinic keratosis: Secondary | ICD-10-CM | POA: Diagnosis not present

## 2024-01-28 DIAGNOSIS — L820 Inflamed seborrheic keratosis: Secondary | ICD-10-CM | POA: Diagnosis not present

## 2024-02-25 ENCOUNTER — Encounter: Payer: Self-pay | Admitting: Hematology and Oncology

## 2024-03-02 ENCOUNTER — Encounter: Payer: Self-pay | Admitting: Hematology and Oncology

## 2024-03-08 ENCOUNTER — Encounter: Payer: Self-pay | Admitting: Hematology and Oncology

## 2024-03-17 ENCOUNTER — Ambulatory Visit
Admission: RE | Admit: 2024-03-17 | Discharge: 2024-03-17 | Disposition: A | Discharge status: Home / Self Care | Source: Ambulatory Visit | Attending: Hematology and Oncology | Admitting: Hematology and Oncology

## 2024-03-17 DIAGNOSIS — Z803 Family history of malignant neoplasm of breast: Secondary | ICD-10-CM | POA: Diagnosis not present

## 2024-03-17 DIAGNOSIS — D0501 Lobular carcinoma in situ of right breast: Secondary | ICD-10-CM

## 2024-03-17 DIAGNOSIS — Z1239 Encounter for other screening for malignant neoplasm of breast: Secondary | ICD-10-CM | POA: Diagnosis not present

## 2024-03-17 MED ORDER — GADOPICLENOL 0.5 MMOL/ML IV SOLN: 8.0000 mL | Freq: Once | INTRAVENOUS | Status: AC | PRN

## 2024-03-18 ENCOUNTER — Ambulatory Visit: Payer: Self-pay

## 2024-03-18 NOTE — Telephone Encounter (Addendum)
 Called pt per Np note below. Pt verbalized understanding.----- Message from Conway Dennis sent at 03/17/2024  3:28 PM EDT ----- MRI negative, please let patient know ----- Message ----- From: Interface, Rad Results In Sent: 03/17/2024  12:32 PM EDT To: Cameron Cea, MD

## 2024-03-30 DIAGNOSIS — R300 Dysuria: Secondary | ICD-10-CM | POA: Diagnosis not present

## 2024-03-30 DIAGNOSIS — R350 Frequency of micturition: Secondary | ICD-10-CM | POA: Diagnosis not present

## 2024-04-18 NOTE — Assessment & Plan Note (Signed)
 Palpable left breast mass, mammogram on 02/24/20 showed a 1.6cm cyst corresponding to the palpable mass, and a 1.4cm mass at the 5 o'clock position possibly representing a papilloma.  Biopsy on 03/02/20 showed no evidence of malignancy.   Right breast biopsy on 08/02/20 showed ductal papilloma and fibroadenoma. Left breast biopsy on 09/03/20 showed intraductal papilloma and no evidence of malignancy.    Bilateral lumpectomies on 12/05/20 with Dr. 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   Current treatment: Tamoxifen  20 mg daily started 12/25/2020 dose reduced to 10 mg daily on 04/15/2022 dose reduced to 5 mg a day on 04/20/2023.   Tamoxifen  toxicities:  Hot flashes Joint stiffness and achiness   Recommended reducing the dose of tamoxifen  to 5 mg a day   Breast cancer surveillance: 1.  mammogram 08/24/23 benign 2.  Breast MRI 03/17/2024: Benign breast density category C   Return to clinic in 1 year for follow-up

## 2024-04-19 ENCOUNTER — Inpatient Hospital Stay: Payer: BC Managed Care – PPO | Attending: Hematology and Oncology | Admitting: Hematology and Oncology

## 2024-04-19 VITALS — BP 126/70 | HR 88 | Temp 98.4°F | Resp 17 | Ht 64.0 in | Wt 187.5 lb

## 2024-04-19 DIAGNOSIS — D0501 Lobular carcinoma in situ of right breast: Secondary | ICD-10-CM | POA: Diagnosis not present

## 2024-04-19 DIAGNOSIS — Z79810 Long term (current) use of selective estrogen receptor modulators (SERMs): Secondary | ICD-10-CM | POA: Diagnosis not present

## 2024-04-19 MED ORDER — TAMOXIFEN CITRATE 10 MG PO TABS: 5.0000 mg | ORAL_TABLET | Freq: Every day | ORAL | 1 refills | Status: AC

## 2024-04-19 NOTE — Progress Notes (Signed)
 Patient Care Team: Shona Norleen PEDLAR, MD as PCP - General (Internal Medicine)  DIAGNOSIS:  Encounter Diagnosis  Name Primary?   Lobular carcinoma in situ (LCIS) of right breast Yes      CHIEF COMPLIANT: Follow-up on tamoxifen  therapy  HISTORY OF PRESENT ILLNESS:   History of Present Illness Molly Roth is a 53 year old female with breast cancer who presents for follow-up regarding her MRI and tamoxifen  use.  The MRI performed on June 19th showed a density of B. She is currently taking tamoxifen , half a tablet daily. She reports improvement in hot flashes but experiences worsening joint stiffness. Plantar fasciitis also contributes to her discomfort.  She engages in regular walking as her primary form of exercise and is considering increasing the frequency to five days a week for thirty minutes each session.     ALLERGIES:  is allergic to codeine.  MEDICATIONS:  Current Outpatient Medications  Medication Sig Dispense Refill   atorvastatin (LIPITOR) 40 MG tablet Take 40 mg by mouth daily.     valACYclovir  (VALTREX ) 1000 MG tablet TAKE 2 TABLETS BY MOUTH AND REPEAT IN 12 HOURS WITH A FULL GLASS OF WATER      tamoxifen  (NOLVADEX ) 10 MG tablet Take 0.5 tablets (5 mg total) by mouth daily. 90 tablet 1   No current facility-administered medications for this visit.    PHYSICAL EXAMINATION: ECOG PERFORMANCE STATUS: 1 - Symptomatic but completely ambulatory  Vitals:   04/19/24 0909  BP: 126/70  Pulse: 88  Resp: 17  Temp: 98.4 F (36.9 C)  SpO2: 98%   Filed Weights   04/19/24 0909  Weight: 187 lb 8 oz (85 kg)      LABORATORY DATA:  I have reviewed the data as listed     No data to display          Lab Results  Component Value Date   WBC 5.7 11/28/2020   HGB 11.3 (L) 11/28/2020   HCT 35.2 (L) 11/28/2020   MCV 84.6 11/28/2020   PLT 351 11/28/2020    ASSESSMENT & PLAN:  Lobular carcinoma in situ (LCIS) of right breast Palpable left breast mass,  mammogram on 02/24/20 showed a 1.6cm cyst corresponding to the palpable mass, and a 1.4cm mass at the 5 o'clock position possibly representing a papilloma.  Biopsy on 03/02/20 showed no evidence of malignancy.   Right breast biopsy on 08/02/20 showed ductal papilloma and fibroadenoma. Left breast biopsy on 09/03/20 showed intraductal papilloma and no evidence of malignancy.    Bilateral lumpectomies on 12/05/20 with Dr. 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   Current treatment: Tamoxifen  20 mg daily started 12/25/2020 dose reduced to 10 mg daily on 04/15/2022 dose reduced to 5 mg a day on 04/20/2023.   Tamoxifen  toxicities:  Hot flashes Joint stiffness and achiness   Recommended reducing the dose of tamoxifen  to 5 mg a day   Breast cancer surveillance: 1.  mammogram 08/24/23 benign 2.  Breast MRI 03/17/2024: Benign breast density category C   Return to clinic in 1 year for follow-up ------------------------------------- Assessment and Plan Assessment & Plan Breast cancer surveillance MRI results show density B, indicating less dense breast tissue. Mammograms are sensitive and unlikely to miss abnormalities. MRI is the gold standard and looks excellent. - Continue biennial MRIs and annual mammograms. - Schedule mammogram for November this year. - Follow up in  one year.  Chronic plantar fasciitis with associated joint stiffness Symptoms suggest underlying inflammation. - Discuss anti-inflammatory diet focusing on vegetables and plant-based foods, reducing red meat and alcohol. - Advise against turmeric supplements due to interaction with tamoxifen . - Encourage regular walking as exercise, aiming for five days a week, 30 minutes per session. - Recommend walking in nature to potentially decrease blood pressure and improve quality of life.      No orders of the  defined types were placed in this encounter.  The patient has a good understanding of the overall plan. she agrees with it. she will call with any problems that may develop before the next visit here. Total time spent: 30 mins including face to face time and time spent for planning, charting and co-ordination of care   Naomi MARLA Chad, MD 04/19/24

## 2024-04-20 DIAGNOSIS — E782 Mixed hyperlipidemia: Secondary | ICD-10-CM | POA: Diagnosis not present

## 2024-04-20 DIAGNOSIS — E669 Obesity, unspecified: Secondary | ICD-10-CM | POA: Diagnosis not present

## 2024-04-25 DIAGNOSIS — I10 Essential (primary) hypertension: Secondary | ICD-10-CM | POA: Diagnosis not present

## 2024-04-25 DIAGNOSIS — D509 Iron deficiency anemia, unspecified: Secondary | ICD-10-CM | POA: Diagnosis not present

## 2024-04-25 DIAGNOSIS — E782 Mixed hyperlipidemia: Secondary | ICD-10-CM | POA: Diagnosis not present

## 2024-04-25 DIAGNOSIS — N951 Menopausal and female climacteric states: Secondary | ICD-10-CM | POA: Diagnosis not present

## 2024-04-27 ENCOUNTER — Encounter: Payer: Self-pay | Admitting: Internal Medicine

## 2024-07-21 ENCOUNTER — Other Ambulatory Visit: Payer: Self-pay | Admitting: Hematology and Oncology

## 2024-07-21 DIAGNOSIS — Z1231 Encounter for screening mammogram for malignant neoplasm of breast: Secondary | ICD-10-CM

## 2024-08-01 DIAGNOSIS — E782 Mixed hyperlipidemia: Secondary | ICD-10-CM | POA: Diagnosis not present

## 2024-08-01 DIAGNOSIS — E669 Obesity, unspecified: Secondary | ICD-10-CM | POA: Diagnosis not present

## 2024-08-02 DIAGNOSIS — M79672 Pain in left foot: Secondary | ICD-10-CM | POA: Diagnosis not present

## 2024-08-02 DIAGNOSIS — M722 Plantar fascial fibromatosis: Secondary | ICD-10-CM | POA: Diagnosis not present

## 2024-08-02 DIAGNOSIS — M79671 Pain in right foot: Secondary | ICD-10-CM | POA: Diagnosis not present

## 2024-08-04 DIAGNOSIS — X32XXXD Exposure to sunlight, subsequent encounter: Secondary | ICD-10-CM | POA: Diagnosis not present

## 2024-08-04 DIAGNOSIS — L570 Actinic keratosis: Secondary | ICD-10-CM | POA: Diagnosis not present

## 2024-08-05 DIAGNOSIS — I10 Essential (primary) hypertension: Secondary | ICD-10-CM | POA: Diagnosis not present

## 2024-08-05 DIAGNOSIS — N951 Menopausal and female climacteric states: Secondary | ICD-10-CM | POA: Diagnosis not present

## 2024-08-05 DIAGNOSIS — E782 Mixed hyperlipidemia: Secondary | ICD-10-CM | POA: Diagnosis not present

## 2024-08-05 DIAGNOSIS — D509 Iron deficiency anemia, unspecified: Secondary | ICD-10-CM | POA: Diagnosis not present

## 2024-08-10 ENCOUNTER — Encounter: Payer: Self-pay | Admitting: Dermatology

## 2024-08-23 ENCOUNTER — Ambulatory Visit
Admission: RE | Admit: 2024-08-23 | Discharge: 2024-08-23 | Disposition: A | Discharge status: Home / Self Care | Source: Ambulatory Visit | Attending: Hematology and Oncology | Admitting: Hematology and Oncology

## 2024-08-23 DIAGNOSIS — Z1231 Encounter for screening mammogram for malignant neoplasm of breast: Secondary | ICD-10-CM | POA: Diagnosis not present

## 2024-08-30 DIAGNOSIS — M722 Plantar fascial fibromatosis: Secondary | ICD-10-CM | POA: Diagnosis not present

## 2024-08-30 DIAGNOSIS — M79671 Pain in right foot: Secondary | ICD-10-CM | POA: Diagnosis not present

## 2025-04-20 ENCOUNTER — Ambulatory Visit: Admitting: Hematology and Oncology
